# Patient Record
Sex: Female | Born: 1940 | Race: White | Hispanic: No | Marital: Married | Smoking: Former smoker
Health system: Southern US, Community
[De-identification: ages and names within clinical notes are randomized; demographics above are authoritative.]

## PROBLEM LIST (undated history)

## (undated) DIAGNOSIS — M2540 Effusion, unspecified joint: Secondary | ICD-10-CM

## (undated) DIAGNOSIS — M2550 Pain in unspecified joint: Secondary | ICD-10-CM

## (undated) DIAGNOSIS — C50919 Malignant neoplasm of unspecified site of unspecified female breast: Secondary | ICD-10-CM

## (undated) DIAGNOSIS — H269 Unspecified cataract: Secondary | ICD-10-CM

## (undated) DIAGNOSIS — E785 Hyperlipidemia, unspecified: Secondary | ICD-10-CM

## (undated) DIAGNOSIS — M1990 Unspecified osteoarthritis, unspecified site: Secondary | ICD-10-CM

## (undated) DIAGNOSIS — K219 Gastro-esophageal reflux disease without esophagitis: Secondary | ICD-10-CM

## (undated) HISTORY — PX: TUBAL LIGATION: SHX77

## (undated) HISTORY — PX: EYE SURGERY: SHX253

## (undated) HISTORY — PX: KNEE SURGERY: SHX244

## (undated) HISTORY — DX: Gastro-esophageal reflux disease without esophagitis: K21.9

## (undated) HISTORY — DX: Hyperlipidemia, unspecified: E78.5

## (undated) HISTORY — PX: LAMINECTOMY AND MICRODISCECTOMY SPINE: SHX1914

## (undated) HISTORY — PX: TONSILLECTOMY AND ADENOIDECTOMY: SUR1326

## (undated) HISTORY — DX: Malignant neoplasm of unspecified site of unspecified female breast: C50.919

## (undated) HISTORY — PX: CHOLECYSTECTOMY: SHX55

---

## 1997-09-15 ENCOUNTER — Other Ambulatory Visit: Admission: RE | Admit: 1997-09-15 | Discharge: 1997-09-15 | Payer: Self-pay | Admitting: *Deleted

## 1998-10-05 ENCOUNTER — Other Ambulatory Visit: Admission: RE | Admit: 1998-10-05 | Discharge: 1998-10-05 | Payer: Self-pay | Admitting: *Deleted

## 1999-10-07 ENCOUNTER — Encounter: Admission: RE | Admit: 1999-10-07 | Discharge: 1999-10-07 | Payer: Self-pay | Admitting: *Deleted

## 1999-11-07 ENCOUNTER — Other Ambulatory Visit: Admission: RE | Admit: 1999-11-07 | Discharge: 1999-11-07 | Payer: Self-pay | Admitting: *Deleted

## 2000-10-21 ENCOUNTER — Encounter: Admission: RE | Admit: 2000-10-21 | Discharge: 2000-10-21 | Payer: Self-pay | Admitting: *Deleted

## 2000-11-03 ENCOUNTER — Other Ambulatory Visit: Admission: RE | Admit: 2000-11-03 | Discharge: 2000-11-03 | Payer: Self-pay | Admitting: *Deleted

## 2001-08-26 ENCOUNTER — Ambulatory Visit (HOSPITAL_COMMUNITY): Admission: RE | Admit: 2001-08-26 | Discharge: 2001-08-26 | Payer: Self-pay | Admitting: Internal Medicine

## 2001-10-26 ENCOUNTER — Encounter: Admission: RE | Admit: 2001-10-26 | Discharge: 2001-10-26 | Payer: Self-pay | Admitting: *Deleted

## 2001-11-16 ENCOUNTER — Other Ambulatory Visit: Admission: RE | Admit: 2001-11-16 | Discharge: 2001-11-16 | Payer: Self-pay | Admitting: *Deleted

## 2002-02-18 ENCOUNTER — Ambulatory Visit (HOSPITAL_COMMUNITY): Admission: RE | Admit: 2002-02-18 | Discharge: 2002-02-18 | Payer: Self-pay | Admitting: Internal Medicine

## 2002-02-18 ENCOUNTER — Encounter: Payer: Self-pay | Admitting: Internal Medicine

## 2002-08-01 ENCOUNTER — Other Ambulatory Visit: Admission: RE | Admit: 2002-08-01 | Discharge: 2002-08-01 | Payer: Self-pay | Admitting: Dermatology

## 2002-11-01 ENCOUNTER — Encounter: Admission: RE | Admit: 2002-11-01 | Discharge: 2002-11-01 | Payer: Self-pay | Admitting: *Deleted

## 2002-11-24 ENCOUNTER — Other Ambulatory Visit: Admission: RE | Admit: 2002-11-24 | Discharge: 2002-11-24 | Payer: Self-pay | Admitting: *Deleted

## 2003-10-05 ENCOUNTER — Ambulatory Visit (HOSPITAL_COMMUNITY): Admission: RE | Admit: 2003-10-05 | Discharge: 2003-10-05 | Payer: Self-pay | Admitting: Family Medicine

## 2003-11-02 ENCOUNTER — Encounter: Admission: RE | Admit: 2003-11-02 | Discharge: 2003-11-02 | Payer: Self-pay | Admitting: *Deleted

## 2004-02-15 ENCOUNTER — Ambulatory Visit: Payer: Self-pay | Admitting: Internal Medicine

## 2004-07-04 ENCOUNTER — Ambulatory Visit: Payer: Self-pay | Admitting: Orthopedic Surgery

## 2004-07-10 ENCOUNTER — Ambulatory Visit (HOSPITAL_COMMUNITY): Admission: RE | Admit: 2004-07-10 | Discharge: 2004-07-10 | Payer: Self-pay | Admitting: Orthopedic Surgery

## 2004-07-24 ENCOUNTER — Ambulatory Visit: Payer: Self-pay | Admitting: Orthopedic Surgery

## 2004-07-25 ENCOUNTER — Encounter (HOSPITAL_COMMUNITY): Admission: RE | Admit: 2004-07-25 | Discharge: 2004-08-24 | Payer: Self-pay | Admitting: Orthopedic Surgery

## 2004-08-14 ENCOUNTER — Ambulatory Visit: Payer: Self-pay | Admitting: Orthopedic Surgery

## 2004-11-05 ENCOUNTER — Encounter: Admission: RE | Admit: 2004-11-05 | Discharge: 2004-11-05 | Payer: Self-pay | Admitting: *Deleted

## 2005-06-18 ENCOUNTER — Ambulatory Visit (HOSPITAL_COMMUNITY): Admission: RE | Admit: 2005-06-18 | Discharge: 2005-06-18 | Payer: Self-pay | Admitting: Family Medicine

## 2005-11-11 ENCOUNTER — Encounter: Admission: RE | Admit: 2005-11-11 | Discharge: 2005-11-11 | Payer: Self-pay | Admitting: *Deleted

## 2005-12-01 ENCOUNTER — Ambulatory Visit: Payer: Self-pay | Admitting: Orthopedic Surgery

## 2005-12-04 ENCOUNTER — Ambulatory Visit (HOSPITAL_COMMUNITY): Admission: RE | Admit: 2005-12-04 | Discharge: 2005-12-04 | Payer: Self-pay | Admitting: Orthopedic Surgery

## 2005-12-11 ENCOUNTER — Ambulatory Visit: Payer: Self-pay | Admitting: Orthopedic Surgery

## 2006-11-23 ENCOUNTER — Ambulatory Visit (HOSPITAL_COMMUNITY): Admission: RE | Admit: 2006-11-23 | Discharge: 2006-11-23 | Payer: Self-pay | Admitting: Obstetrics and Gynecology

## 2007-11-18 ENCOUNTER — Encounter: Payer: Self-pay | Admitting: Internal Medicine

## 2007-11-24 ENCOUNTER — Ambulatory Visit (HOSPITAL_COMMUNITY): Admission: RE | Admit: 2007-11-24 | Discharge: 2007-11-24 | Payer: Self-pay | Admitting: Family Medicine

## 2007-11-29 ENCOUNTER — Ambulatory Visit (HOSPITAL_COMMUNITY): Admission: RE | Admit: 2007-11-29 | Discharge: 2007-11-29 | Payer: Self-pay | Admitting: Family Medicine

## 2008-07-06 ENCOUNTER — Ambulatory Visit: Payer: Self-pay | Admitting: Cardiology

## 2008-07-07 ENCOUNTER — Ambulatory Visit: Payer: Self-pay | Admitting: Cardiology

## 2008-07-07 ENCOUNTER — Encounter: Payer: Self-pay | Admitting: Cardiology

## 2008-07-07 ENCOUNTER — Ambulatory Visit (HOSPITAL_COMMUNITY): Admission: RE | Admit: 2008-07-07 | Discharge: 2008-07-07 | Payer: Self-pay | Admitting: Cardiology

## 2008-07-25 ENCOUNTER — Telehealth: Payer: Self-pay | Admitting: Cardiology

## 2008-11-01 DIAGNOSIS — E785 Hyperlipidemia, unspecified: Secondary | ICD-10-CM | POA: Insufficient documentation

## 2008-12-11 ENCOUNTER — Ambulatory Visit (HOSPITAL_COMMUNITY): Admission: RE | Admit: 2008-12-11 | Discharge: 2008-12-11 | Payer: Self-pay | Admitting: Family Medicine

## 2008-12-18 ENCOUNTER — Encounter: Admission: RE | Admit: 2008-12-18 | Discharge: 2008-12-18 | Payer: Self-pay | Admitting: Family Medicine

## 2009-09-03 ENCOUNTER — Ambulatory Visit (HOSPITAL_COMMUNITY): Admission: RE | Admit: 2009-09-03 | Discharge: 2009-09-03 | Payer: Self-pay | Admitting: Family Medicine

## 2009-11-23 ENCOUNTER — Encounter: Admission: RE | Admit: 2009-11-23 | Discharge: 2009-11-23 | Payer: Self-pay | Admitting: Family Medicine

## 2009-12-17 ENCOUNTER — Ambulatory Visit (HOSPITAL_COMMUNITY): Admission: RE | Admit: 2009-12-17 | Discharge: 2009-12-17 | Payer: Self-pay | Admitting: Family Medicine

## 2010-03-16 ENCOUNTER — Encounter: Payer: Self-pay | Admitting: Family Medicine

## 2010-07-08 ENCOUNTER — Ambulatory Visit (INDEPENDENT_AMBULATORY_CARE_PROVIDER_SITE_OTHER): Payer: Medicare Other | Admitting: Internal Medicine

## 2010-07-08 DIAGNOSIS — K279 Peptic ulcer, site unspecified, unspecified as acute or chronic, without hemorrhage or perforation: Secondary | ICD-10-CM

## 2010-07-09 NOTE — Letter (Signed)
Jul 06, 2008    Corrie Mckusick, MD  [id number] [place]. [place], [place] [address]   RE:  Whitney Burns, Whitney Burns  MRN:  [id number]  /  DOB:  25-Apr-1940   Dear Whitney Burns:   It was my pleasure evaluating Whitney Burns on an urgent basis in the  office today at your request.  As you know, this very nice lady has  enjoyed generally excellent health.  She has no known prior  cardiovascular disease.  She has had no hypertension nor diabetes.  She  does have moderate hyperlipidemia, controlled with nonpharmacologic  means.  She has a less than 10-pack-year history of cigarette smoking,  but has not smoked for at least the past 10 years.  There is no striking  family history for coronary disease.  She awoke in the early morning  hours today, noting vague discomfort in her medial upper left arm.  She  describes this is as a pinching sensation.  She subsequently developed  an unusual sensation in the entire left upper extremity that she would  not characterize as numbness or pins and needles and certainly not as  pain.  She was unable to return to sleep.  These symptoms persisted,  prompting her to seek attention in your office where EKG was normal.  She was given sublingual nitroglycerin with some improvement in her  symptoms.  At the present time, she feels perfectly fine.   PAST MEDICAL HISTORY:  Otherwise notable for elective surgeries  including tonsillectomy, cholecystectomy, and tubal ligation.   ALLERGIES:  She has no known allergies.   CURRENT MEDICATIONS:  1. Estrogen replacement therapy.  2. Fish oil 3 capsules per day.  3. Aspirin 81 mg daily.  4. Calcium b.i.d.  5. Folate 1200 mg daily.  6. Vitamins.  7. Ibuprofen 1 b.i.d.   SOCIAL HISTORY:  Retired, but remains quite active including playing  tennis and riding a stationary bicycle.  She is married with 2 adult  children.  She does not use excessive amounts of alcohol.   FAMILY HISTORY:  Positive for emphysema in her father and  pneumonia in  her mother resulting in her death.   REVIEW OF SYSTEMS:  Notable for need for corrective lenses for near  vision, a dermatologic condition for which she is under the care of a  dermatologist, and a history of cartilage problems in the right knee.  All other systems reviewed and are negative.   On exam, pleasant, proportionate woman in no acute distress.  The weight is 142, blood pressure 120/60.  Respirations 12 and  unlabored.  HEENT:  EOMs full; pupils equal, round, reactive to light; normal lids  and conjunctivae; normal oral mucosa.  NECK:  No jugular venous distention; normal carotid upstrokes without  bruits.  ENDOCRINE:  No thyromegaly.  HEMATOPOIETIC:  No adenopathy.  LUNGS:  Clear.  CARDIAC:  Normal first and second heart sounds; normal PMI.  ABDOMEN:  Soft and nontender; normal bowel sounds; no organomegaly.  EXTREMITIES:  No edema; full range of motion in the left upper  extremity; lower.  NEUROLOGIC:  Symmetric strength and tone; normal cranial nerves.  PSYCHIATRIC:  Alert and oriented; normal affect.   EKG:  Normal sinus rhythm; nonspecific T-wave abnormality in the lateral  leads; otherwise unremarkable.   Laboratory from your office includes a lipid profile with total  cholesterol of 265, triglycerides of 114, HDL of 70, and LDL of 166.   IMPRESSION:  Whitney Burns has very atypical  symptoms, it sound more  neurologic than cardiac.  She has good exercise tolerance with no  exercise-induced symptoms.  She has few cardiovascular risk factors  other than hyperlipidemia and postmenopausal status.  Her likelihood of  coronary artery disease is low.  We will proceed with a stress  echocardiogram at her earliest convenience, hopefully tomorrow.   She has moderate hyperlipidemia.  I agree with your assessment that  pharmacologic therapy is not necessary.  We provided her with  information regarding a low-fat diet as well as with a suggestion that  she use  Benecol margarine, oat bran, and possibly Metamucil.   If Whitney Burns's stress echocardiogram is negative, she will not require  additional Cardiology followup.  I thank so much for sending this nice  woman to see me.  If you can change her blood pressure from 120/60 to  135/80, I would appreciate it.    Sincerely,      Whitney Friends. Dietrich Pates, MD, [facility]  Electronically Signed    RMR/MedQ  DD: 07/06/2008  DT: 07/07/2008  Job #: [id number]

## 2010-07-12 NOTE — Op Note (Signed)
[facility]  Patient:    Whitney Burns, Whitney Burns Visit Number: [id number] MRN: [id number]          Service Type: END Location: DAY Attending Physician:  Malissa Hippo Dictated by:   Lionel December, M.D. Proc. Date: 08/26/01 Admit Date:  08/26/2001 Discharge Date: 08/26/2001   CC:         Renne Musca, M.D.   Operative Report  PROCEDURE:  Esophagogastroduodenoscopy with esophageal dilatation followed by screening colonoscopy.  ENDOSCOPIST:  Lionel December, M.D.  INDICATION:  The patient is a 70 year old Caucasian female with a few months history of dysphagia primarily to solids.  There is no history of heartburn. She is also interested in having screening colonoscopy.  She is average risk for colorectal carcinoma.  Both the procedures and risks were reviewed with the patient and informed consent was obtained.  PREOPERATIVE MEDICATIONS:  Cetacaine spray for oropharyngeal topical anesthesia, Demerol 40 mg IV and Versed 6 mg in divided dose.  INSTRUMENT:  Olympus video system.  FINDINGS:  The procedures were performed in endoscopy suite.  The patients vital signs and O2 saturations were monitored during procedures and remained stable.  PROCEDURE #1 - Esophagogastroduodenoscopy:  The patient was placed in the left lateral decubitus position.  Endoscope was passed via oropharynx without any difficulty into esophagus.  Esophagus:  Mucosa of the esophagus was normal throughout.  Squamocolumnar junction was wavy but no hernia, ring or stricture were noted.  I also did not see any Zenkers diverticulum in the proximal esophagus.  Stomach:  It was empty and distended very well with insufflation.  Folds in the proximal stomach were normal.  Examination of the mucosa revealed a few petechiae of antrum with streaks of erythematous mucosa.  No erosions or ulcers were noted.  Pyloric channel was patent.  Angularis and fundus were examined by retroflexing  the scope and were normal.  Duodenum:  Examination of the bulb and second part of the duodenum was normal. Endoscope was withdrawn.  Esophageal dilatation was performed by passing 54-French Maloney dilator through the esophagus completely.  Some resistance was noted on passing this dilator but I was able to pass it completely.  As the dilator was withdrawn, endoscope was passed again and there was a bow-shaped mucosal tear in cervical esophagus suggesting disruption of esophageal web which was not apparent on initial exam.  Stomach was decompressed and endoscope was withdrawn and the patient was prepared for procedure #2.  PROCEDURE #2 - Colonoscopy:  Rectal exam was performed and no abnormality noted on external or digital exam.  Scope was placed in the rectum and advanced under direct vision to the sigmoid colon and beyond.  Scope was passed into the cecum, which was identified by appendiceal orifice and ileocecal valve.  Pictures were taken for the record.  As the scope was withdrawn, colonic mucosa was carefully examined.  There was a single diverticulum at hepatic flexure and another diverticulum at sigmoid colon. There were no polyps or tumor masses.  The rectal mucosa was normal.  Scope was retroflexed to examine anorectal junction, which was unremarkable. Endoscope was straightened and withdrawn.  The patient tolerated the procedures well.  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 2. Antral gastritis. 3. Two diverticula (one at hepatic flexure, one at sigmoid colon), otherwise    normal colonoscopy.  RECOMMENDATIONS:  H. pylori serology will be checked today.  She should continue yearly Hemoccults.  Unless she were to have symptoms,  she may consider another screening study in 10 years from now. Dictated by:   Lionel December, M.D. Attending Physician:  Malissa Hippo DD:  08/26/01 TD:  08/30/01 Job: [id number]

## 2010-11-28 ENCOUNTER — Other Ambulatory Visit (HOSPITAL_COMMUNITY): Payer: Self-pay | Admitting: Orthopedic Surgery

## 2010-11-28 DIAGNOSIS — R52 Pain, unspecified: Secondary | ICD-10-CM

## 2010-12-02 ENCOUNTER — Ambulatory Visit (HOSPITAL_COMMUNITY): Payer: Medicare Other

## 2010-12-10 ENCOUNTER — Ambulatory Visit (HOSPITAL_COMMUNITY)
Admission: RE | Admit: 2010-12-10 | Discharge: 2010-12-10 | Disposition: A | Discharge status: Home / Self Care | Payer: Medicare Other | Source: Ambulatory Visit | Attending: Orthopedic Surgery | Admitting: Orthopedic Surgery

## 2010-12-10 DIAGNOSIS — R9389 Abnormal findings on diagnostic imaging of other specified body structures: Secondary | ICD-10-CM | POA: Insufficient documentation

## 2010-12-10 DIAGNOSIS — M899 Disorder of bone, unspecified: Secondary | ICD-10-CM | POA: Insufficient documentation

## 2010-12-10 DIAGNOSIS — R52 Pain, unspecified: Secondary | ICD-10-CM

## 2010-12-10 DIAGNOSIS — M533 Sacrococcygeal disorders, not elsewhere classified: Secondary | ICD-10-CM | POA: Insufficient documentation

## 2011-02-06 ENCOUNTER — Encounter: Payer: Self-pay | Admitting: Cardiology

## 2011-05-22 ENCOUNTER — Encounter (INDEPENDENT_AMBULATORY_CARE_PROVIDER_SITE_OTHER): Payer: Self-pay | Admitting: *Deleted

## 2011-05-22 ENCOUNTER — Other Ambulatory Visit (INDEPENDENT_AMBULATORY_CARE_PROVIDER_SITE_OTHER): Payer: Self-pay | Admitting: *Deleted

## 2011-05-22 DIAGNOSIS — Z1211 Encounter for screening for malignant neoplasm of colon: Secondary | ICD-10-CM

## 2011-08-06 ENCOUNTER — Telehealth (INDEPENDENT_AMBULATORY_CARE_PROVIDER_SITE_OTHER): Payer: Self-pay | Admitting: *Deleted

## 2011-08-06 NOTE — Telephone Encounter (Signed)
PCP/Requesting MD: golding  Name & DOB: Keyara Falconi 26-Nov-2040     Procedure: tcs  Reason/Indication:  screening  Has patient had this procedure before?  yes  If so, when, by whom and where?  08/2001  Is there a family history of colon cancer?  no  Who?  What age when diagnosed?    Is patient diabetic?   no      Does patient have prosthetic heart valve?  no  Do you have a pacemaker?  no  Has patient had joint replacement within last 12 months?  no  Is patient on Coumadin, Plavix and/or Aspirin? yes  Medications: celebrax 200 mg every other day, fish oil bid, asa 81 mg daily, calcium w/ D, glucosamine bid, omeprazole 40 mg daily, multi vit, miralax  Allergies: nkda  Medication Adjustment: asa 2 dasy  Procedure date & time: 09/03/11 at 730

## 2011-08-08 NOTE — Telephone Encounter (Signed)
agree

## 2011-08-20 ENCOUNTER — Encounter (HOSPITAL_COMMUNITY): Payer: Self-pay

## 2011-08-27 ENCOUNTER — Other Ambulatory Visit (HOSPITAL_COMMUNITY): Payer: Self-pay | Admitting: Family Medicine

## 2011-08-27 DIAGNOSIS — Z139 Encounter for screening, unspecified: Secondary | ICD-10-CM

## 2011-08-27 DIAGNOSIS — E559 Vitamin D deficiency, unspecified: Secondary | ICD-10-CM

## 2011-09-01 ENCOUNTER — Inpatient Hospital Stay (HOSPITAL_COMMUNITY): Admission: RE | Admit: 2011-09-01 | Payer: Medicare Other | Source: Ambulatory Visit

## 2011-09-02 ENCOUNTER — Ambulatory Visit (HOSPITAL_COMMUNITY): Payer: Medicare Other

## 2011-09-02 ENCOUNTER — Other Ambulatory Visit (INDEPENDENT_AMBULATORY_CARE_PROVIDER_SITE_OTHER): Payer: Self-pay | Admitting: *Deleted

## 2011-09-02 ENCOUNTER — Other Ambulatory Visit (HOSPITAL_COMMUNITY): Payer: Medicare Other

## 2011-09-02 DIAGNOSIS — Z1211 Encounter for screening for malignant neoplasm of colon: Secondary | ICD-10-CM

## 2011-09-03 ENCOUNTER — Encounter (HOSPITAL_COMMUNITY): Payer: Self-pay | Admitting: *Deleted

## 2011-09-03 ENCOUNTER — Ambulatory Visit (HOSPITAL_COMMUNITY)
Admission: RE | Admit: 2011-09-03 | Discharge: 2011-09-03 | Disposition: A | Discharge status: Home / Self Care | Payer: Medicare Other | Source: Ambulatory Visit | Attending: Internal Medicine | Admitting: Internal Medicine

## 2011-09-03 ENCOUNTER — Encounter (HOSPITAL_COMMUNITY)
Admission: RE | Disposition: A | Discharge status: Home / Self Care | Payer: Self-pay | Source: Ambulatory Visit | Attending: Internal Medicine

## 2011-09-03 DIAGNOSIS — E785 Hyperlipidemia, unspecified: Secondary | ICD-10-CM | POA: Insufficient documentation

## 2011-09-03 DIAGNOSIS — Z1211 Encounter for screening for malignant neoplasm of colon: Secondary | ICD-10-CM | POA: Insufficient documentation

## 2011-09-03 DIAGNOSIS — K5730 Diverticulosis of large intestine without perforation or abscess without bleeding: Secondary | ICD-10-CM | POA: Insufficient documentation

## 2011-09-03 HISTORY — DX: Unspecified osteoarthritis, unspecified site: M19.90

## 2011-09-03 HISTORY — PX: COLONOSCOPY: SHX5424

## 2011-09-03 SURGERY — COLONOSCOPY
Anesthesia: Moderate Sedation

## 2011-09-03 MED ORDER — SODIUM CHLORIDE 0.45 % IV SOLN
Freq: Once | INTRAVENOUS | Status: AC
  Administered 2011-09-03: 1000 mL via INTRAVENOUS

## 2011-09-03 MED ORDER — MEPERIDINE HCL 50 MG/ML IJ SOLN
INTRAMUSCULAR | Status: DC | PRN
  Administered 2011-09-03 (×2): 25 mg via INTRAVENOUS

## 2011-09-03 MED ORDER — SIMETHICONE 40 MG/0.6ML PO SUSP
ORAL | Status: DC | PRN
  Administered 2011-09-03: 09:00:00

## 2011-09-03 MED ORDER — MEPERIDINE HCL 50 MG/ML IJ SOLN
INTRAMUSCULAR | Status: AC
  Filled 2011-09-03: qty 1

## 2011-09-03 MED ORDER — MIDAZOLAM HCL 5 MG/5ML IJ SOLN
INTRAMUSCULAR | Status: AC
  Filled 2011-09-03: qty 10

## 2011-09-03 MED ORDER — MIDAZOLAM HCL 5 MG/5ML IJ SOLN
INTRAMUSCULAR | Status: DC | PRN
  Administered 2011-09-03 (×2): 2 mg via INTRAVENOUS
  Administered 2011-09-03: 1 mg via INTRAVENOUS

## 2011-09-03 NOTE — H&P (Signed)
Whitney Burns is an 71 y.o. female.   Chief Complaint: Patient is here for colonoscopy. HPI: Patient is 71 year old Caucasian female who is here for screening colonoscopy. Patient's last exam was 10 years ago. She denies abdominal pain change in bowel habits bleeding. Family history is negative for colorectal carcinoma.  Past Medical History  Diagnosis Date  . Hyperlipidemia   . Arthritis     Past Surgical History  Procedure Date  . Tonsillectomy and adenoidectomy   . Cholecystectomy   . Tubal ligation     Family History  Problem Relation Age of Onset  . Pneumonia Mother   . Emphysema Father    Social History:  reports that she has quit smoking. She does not have any smokeless tobacco history on file. She reports that she does not drink alcohol or use illicit drugs.  Allergies: No Known Allergies  Medications Prior to Admission  Medication Sig Dispense Refill  . Calcium Carbonate-Vitamin D (CALCIUM + D PO) Take 1 tablet by mouth daily. Unsure of strength      . fish oil-omega-3 fatty acids 1000 MG capsule Take 1 g by mouth 2 (two) times daily.      Marland Kitchen GLUCOSAMINE PO Take 2 tablets by mouth daily. Unknown strength      . meloxicam (MOBIC) 15 MG tablet Take 15 mg by mouth daily.      . Multiple Vitamin (MULTIVITAMIN WITH MINERALS) TABS Take 1 tablet by mouth daily.      Marland Kitchen omeprazole (PRILOSEC) 40 MG capsule Take 40 mg by mouth daily.      . promethazine (PHENERGAN) 25 MG tablet Take 12.5 mg by mouth daily as needed. For sleep      . aspirin EC 81 MG tablet Take 81 mg by mouth daily.      . polyethylene glycol powder (GLYCOLAX/MIRALAX) powder Take 17 g by mouth daily.        No results found for this or any previous visit (from the past 48 hour(s)). No results found.  ROS  Blood pressure 136/77, pulse 64, temperature 98 F (36.7 C), temperature source Oral, resp. rate 20, height 5\' 2"  (1.575 m), weight 132 lb (59.875 kg), SpO2 100.00%. Physical Exam  Constitutional: She  appears well-developed and well-nourished.  HENT:  Mouth/Throat: Oropharynx is clear and moist.  Eyes: Conjunctivae are normal. No scleral icterus.  Neck: No thyromegaly present.  Cardiovascular: Normal rate, regular rhythm and normal heart sounds.   No murmur heard. Respiratory: Effort normal and breath sounds normal.  GI: Soft. She exhibits no distension and no mass. There is no tenderness.  Musculoskeletal: She exhibits no edema.  Lymphadenopathy:    She has no cervical adenopathy.  Neurological: She is alert.  Skin: Skin is warm and dry.     Assessment/Plan Average risk screening colonoscopy.  Paisly Fingerhut U 09/03/2011, 8:27 AM

## 2011-09-03 NOTE — Op Note (Signed)
COLONOSCOPY PROCEDURE REPORT  PATIENT:  Whitney Burns  MR#:  [id number] Birthdate:  06-30-1940, 71 y.o., female Endoscopist:  Dr. Malissa Hippo, MD Referred By:  Dr. Colette Ribas, MD Procedure Date: 09/03/2011  Procedure:   Colonoscopy  Indications:  Patient is 71 year old Caucasian female was undergoing average risk screening colonoscopy. Her last exam was 10 years ago. She has chronic constipation well controlled with diet and polyethylene glycol.  Informed Consent:  The procedure and risks were reviewed with the patient and informed consent was obtained.  Medications:  Demerol 50 mg IV Versed 5 mg IV  Description of procedure:  After a digital rectal exam was performed, that colonoscope was advanced from the anus through the rectum and colon to the area of the cecum, ileocecal valve and appendiceal orifice. The cecum was deeply intubated. These structures were well-seen and photographed for the record. From the level of the cecum and ileocecal valve, the scope was slowly and cautiously withdrawn. The mucosal surfaces were carefully surveyed utilizing scope tip to flexion to facilitate fold flattening as needed. The scope was pulled down into the rectum where a thorough exam including retroflexion was performed.  Findings:   Prep satisfactory. Moderate number of diverticula at sigmoid colon. No evidence of colonic polyps or other abnormalities. Normal rectal mucosa and anal rectal junction.  Therapeutic/Diagnostic Maneuvers Performed:  None  Complications:  None  Cecal Withdrawal Time:  13 minutes  Impression:  Examination performed to cecum. Moderate number of diverticula at sigmoid colon otherwise normal examination.  Recommendations:  Standard instructions given. Next screening examination in 10 years.  REHMAN,NAJEEB U  09/03/2011 9:04 AM  CC: Dr. Colette Ribas, MD & Dr. Bonnetta Barry ref. provider found

## 2011-09-04 ENCOUNTER — Encounter (HOSPITAL_COMMUNITY): Payer: Self-pay | Admitting: Internal Medicine

## 2011-09-04 ENCOUNTER — Ambulatory Visit (HOSPITAL_COMMUNITY)
Admission: RE | Admit: 2011-09-04 | Discharge: 2011-09-04 | Disposition: A | Discharge status: Home / Self Care | Payer: Medicare Other | Source: Ambulatory Visit | Attending: Family Medicine | Admitting: Family Medicine

## 2011-09-04 DIAGNOSIS — Z1231 Encounter for screening mammogram for malignant neoplasm of breast: Secondary | ICD-10-CM | POA: Insufficient documentation

## 2011-09-04 DIAGNOSIS — Z139 Encounter for screening, unspecified: Secondary | ICD-10-CM

## 2011-09-10 ENCOUNTER — Other Ambulatory Visit (HOSPITAL_COMMUNITY): Payer: Medicare Other

## 2011-09-24 ENCOUNTER — Ambulatory Visit (HOSPITAL_COMMUNITY)
Admission: RE | Admit: 2011-09-24 | Discharge: 2011-09-24 | Disposition: A | Discharge status: Home / Self Care | Payer: Medicare Other | Source: Ambulatory Visit | Attending: Family Medicine | Admitting: Family Medicine

## 2011-09-24 DIAGNOSIS — M899 Disorder of bone, unspecified: Secondary | ICD-10-CM | POA: Insufficient documentation

## 2011-09-24 DIAGNOSIS — Z139 Encounter for screening, unspecified: Secondary | ICD-10-CM

## 2011-09-24 DIAGNOSIS — E559 Vitamin D deficiency, unspecified: Secondary | ICD-10-CM | POA: Insufficient documentation

## 2011-09-24 DIAGNOSIS — Z13820 Encounter for screening for osteoporosis: Secondary | ICD-10-CM | POA: Insufficient documentation

## 2012-06-16 ENCOUNTER — Ambulatory Visit (HOSPITAL_COMMUNITY)
Admission: RE | Admit: 2012-06-16 | Discharge: 2012-06-16 | Disposition: A | Discharge status: Home / Self Care | Payer: Medicare Other | Source: Ambulatory Visit | Attending: Family Medicine | Admitting: Family Medicine

## 2012-06-16 DIAGNOSIS — IMO0001 Reserved for inherently not codable concepts without codable children: Secondary | ICD-10-CM | POA: Insufficient documentation

## 2012-06-16 DIAGNOSIS — M6281 Muscle weakness (generalized): Secondary | ICD-10-CM | POA: Insufficient documentation

## 2012-06-16 NOTE — Evaluation (Signed)
Physical Therapy Evaluation  Patient Details  Name: [place] Burns MRN: [id number] Date of Birth: Jul 08, 1940 Charge:  eval Today's Date: 06/16/2012 Time: 1520-1605 PT Time Calculation (min): 45 min              Visit#: 1 of 8  Re-eval: 07/16/12 Assessment Diagnosis: diskectomy Surgical Date: 04/21/12 Prior Therapy: none  Authorization: medicare     Past Medical History:  Past Medical History  Diagnosis Date  . Hyperlipidemia   . Arthritis    Past Surgical History:  Past Surgical History  Procedure Laterality Date  . Tonsillectomy and adenoidectomy    . Cholecystectomy    . Tubal ligation    . Colonoscopy  09/03/2011    Procedure: COLONOSCOPY;  Surgeon: [place] Hippo, MD;  Location: [facility];  Service: Endoscopy;  Laterality: N/A;  730    Subjective Symptoms/Limitations Symptoms:  Whitney Burns states thatn on September 3rd she had extreme pain in her right leg.  It took her several days to be able to put weight through her leg but then the pain subsided. She normally goes down to [place] for six months in the winter.  While she was in  [place] the pain returned so she went to her orthopedic MD who checked her hip out and said that her hip was fine.  When she was at  a golf tournament she began to have  excruciating pain in her R leg.  She had an MRI which showed a bulged disc in her back.  Whitney Burns had  a diskectomy of right L4-5 on 04/21/2012 and is now being referred to physcial therapy.  She states that since the surgery she has been doing great.  She has had no pain since the surgery.   How long can you sit comfortably?: no problem How long can you stand comfortably?: no problem How long can you walk comfortably?: walking daily for two miles a day Pain Assessment Currently in Pain?:  (stiffness in am but no pain.)   Balance Screening Balance Screen Has the patient fallen in the past 6 months: Yes   Assessment RLE Strength Right Hip Flexion: 4/5 Right Hip  Extension: 3/5 Right Hip ABduction: 4/5 Right Knee Flexion: 3+/5 Right Knee Extension: 5/5 Right Ankle Dorsiflexion: 3/5 LLE Strength Left Hip Flexion: 5/5 Left Hip Extension: 3/5 Left Hip ABduction: 5/5 Left Knee Flexion: 5/5 Left Knee Extension: 5/5 Left Ankle Dorsiflexion: 5/5 Lumbar AROM Lumbar Extension:  (decreased 20%) Lumbar - Right Side Bend: wnl Lumbar - Left Side Bend: wnl  Exercise/Treatments    Stretches Active Hamstring Stretch: 3 reps;30 seconds Passive Hamstring Stretch: 3 reps;30 seconds Supine Ab Set: 10 reps Bent Knee Raise: 10 reps   Physical Therapy Assessment and Plan PT Assessment and Plan Clinical Impression Statement: Whitney Burns is a 72 yo female who has had a recent L4-5 diskectomy who has been referred to therapy.  Examination shows decreased core strength and decreased knowledge of proper body mechanics.  PT will benefit from skilled PT to edcucate in body mechanics /posture and core strengthening. Rehab Potential: Good PT Frequency: Min 2X/week PT Duration: 4 weeks PT Treatment/Interventions: Manual techniques;Patient/family education;Therapeutic exercise;Therapeutic activities PT Plan: begin postural t-band ex, standing functional squat, bridge, clam and heelslides.  Advance to SL abduction, prone exercises     Goals Home Exercise Program Pt will Perform Home Exercise Program: Independently PT Short Term Goals Time to Complete Short Term Goals: 2 weeks PT Short Term Goal 1: Pt to be  able to verbalize the importance of good posture and good body mechanics in back care. PT Long Term Goals Time to Complete Long Term Goals: 4 weeks PT Long Term Goal 1: Pt to be I in HEP PT Long Term Goal 2: Pt strength to be wnl to allow pt to go up and down steps, come up from a squatting position without difficult   Problem List Patient Active Problem List  Diagnosis  . HYPERLIPIDEMIA    PT - End of Session Equipment Utilized During Treatment: Gait  belt Activity Tolerance: Patient tolerated treatment well General Behavior During Therapy: WFL for tasks assessed/performed PT Plan of Care PT Home Exercise Plan: given  GP Functional Assessment Tool Used: clinical judgement Functional Limitation: Self care Self Care Current Status (W0981): At least 1 percent but less than 20 percent impaired, limited or restricted Self Care Goal Status (X9147): 0 percent impaired, limited or restricted  Whitney Burns,Whitney Burns 06/16/2012, 4:40 PM  Physician Documentation Your signature is required to indicate approval of the treatment plan as stated above.  Please sign and either send electronically or make a copy of this report for your files and return this physician signed original.   Please mark one 1.__approve of plan  2. ___approve of plan with the following conditions.   ______________________________                                                          _____________________ Physician Signature                                                                                                             Date

## 2012-06-22 ENCOUNTER — Ambulatory Visit (HOSPITAL_COMMUNITY)
Admission: RE | Admit: 2012-06-22 | Discharge: 2012-06-22 | Disposition: A | Discharge status: Home / Self Care | Payer: Medicare Other | Source: Ambulatory Visit | Attending: *Deleted | Admitting: *Deleted

## 2012-06-22 NOTE — Progress Notes (Signed)
Physical Therapy Treatment Patient Details  Name: Whitney Burns MRN: [id number] Date of Birth: [birth year]  Today's Date: 06/22/2012 Time: 1601-1640 PT Time Calculation (min): 39 min  Visit#: 2 of 8  Re-eval: 07/16/12 Charges: Therex x 38'   Authorization: medicare     Subjective: Symptoms/Limitations Symptoms: Pt reports HEP compliance. Pain Assessment Currently in Pain?: No/denies   Exercise/Treatments Stretches Active Hamstring Stretch: 3 reps;30 seconds Standing Functional Squats: 10 reps Scapular Retraction: 10 reps;Theraband Theraband Level (Scapular Retraction): Level 3 (Green) Row: 10 reps;Theraband Theraband Level (Row): Level 3 (Green) Shoulder Extension: 10 reps;Theraband Theraband Level (Shoulder Extension): Level 3 (Green) Supine Ab Set: 10 reps Bent Knee Raise: 10 reps Bridge: 10 reps Straight Leg Raise: 5 reps  Physical Therapy Assessment and Plan PT Assessment and Plan Clinical Impression Statement: Progressed stabilization exercises to improve postural strength and stability. Pt has most difficulty with supine SLR secondary to hip flexor weakness. Pt completes all therex well after initial cueing and demo. Rehab Potential: Good PT Frequency: Min 2X/week PT Duration: 4 weeks PT Treatment/Interventions: Manual techniques;Patient/family education;Therapeutic exercise;Therapeutic activities PT Plan: Begin side lying clams next session. Continue to progress core and lower extremity strength per PT POC.      Problem List Patient Active Problem List   Diagnosis Date Noted  . HYPERLIPIDEMIA 11/01/2008    PT - End of Session Equipment Utilized During Treatment: Gait belt Activity Tolerance: Patient tolerated treatment well General Behavior During Therapy: [facility] for tasks assessed/performed  Seth Bake, PTA  06/22/2012, 5:02 PM

## 2012-06-24 ENCOUNTER — Ambulatory Visit (INDEPENDENT_AMBULATORY_CARE_PROVIDER_SITE_OTHER): Payer: Medicare Other | Admitting: Otolaryngology

## 2012-06-24 DIAGNOSIS — H6120 Impacted cerumen, unspecified ear: Secondary | ICD-10-CM

## 2012-06-24 DIAGNOSIS — H903 Sensorineural hearing loss, bilateral: Secondary | ICD-10-CM

## 2012-06-25 ENCOUNTER — Ambulatory Visit (HOSPITAL_COMMUNITY)
Admission: RE | Admit: 2012-06-25 | Discharge: 2012-06-25 | Disposition: A | Discharge status: Home / Self Care | Payer: Medicare Other | Source: Ambulatory Visit | Attending: Family Medicine | Admitting: Family Medicine

## 2012-06-25 DIAGNOSIS — M6281 Muscle weakness (generalized): Secondary | ICD-10-CM | POA: Insufficient documentation

## 2012-06-25 DIAGNOSIS — IMO0001 Reserved for inherently not codable concepts without codable children: Secondary | ICD-10-CM | POA: Insufficient documentation

## 2012-06-25 NOTE — Evaluation (Signed)
Physical Therapy Discharge   Patient Details  Name: Whitney Burns MRN: [id number] Date of Birth: 08-Dec-1940  Today's Date: 06/25/2012 Time: 1521-1600 PT Time Calculation (min): 39 min              Visit#: 3 of 8  Re-eval: 07/16/12 Authorization: medicare     Charges: Therex x 25' MMT x 1 ROMM x 1    Past Medical History:  Past Medical History  Diagnosis Date  . Hyperlipidemia   . Arthritis    Past Surgical History:  Past Surgical History  Procedure Laterality Date  . Tonsillectomy and adenoidectomy    . Cholecystectomy    . Tubal ligation    . Colonoscopy  09/03/2011    Procedure: COLONOSCOPY;  Surgeon: Malissa Hippo, MD;  Location: [facility];  Service: Endoscopy;  Laterality: N/A;  730    Subjective Symptoms/Limitations Symptoms: Pt states that the stiffness in her low back has decreased since starting therapy. Pain Assessment Currently in Pain?: No/denies    Assessment RLE Strength Right Hip Flexion: 5/5 Right Hip Extension: 4/5 Right Hip ABduction: 5/5 Right Knee Flexion:  (4+/5 was 3+/5) Right Knee Extension: 5/5 Right Ankle Dorsiflexion: 5/5 LLE Strength Left Hip Flexion: 5/5 Left Hip Extension: 4/5 Left Hip ABduction: 5/5 Left Knee Flexion: 5/5 Left Knee Extension: 5/5 Left Ankle Dorsiflexion: 5/5 Lumbar AROM Lumbar Extension: wnl Lumbar - Right Side Bend: wnl Lumbar - Left Side Bend: wnl  Exercise/Treatments Standing Scapular Retraction: 10 reps;Theraband Theraband Level (Scapular Retraction): Level 3 (Green) Row: 10 reps;Theraband Theraband Level (Row): Level 3 (Green) Shoulder Extension: 10 reps;Theraband Theraband Level (Shoulder Extension): Level 3 (Green) Seated Other Seated Lumbar Exercises: Heel/toe roll in/out 10 x 5" each Supine Ab Set: 10 reps Bridge: 10 reps Straight Leg Raise: 10 reps Sidelying Clam: 10 reps;5 seconds Hip Abduction: 10 reps Prone  Straight Leg Raise: 10 reps Other Prone Lumbar Exercises: Heel  squeeze 10x5"  Physical Therapy Assessment and Plan PT Assessment and Plan Clinical Impression Statement: Pt is pain free and without complaint throughout session. Pt completes therex independently with correct form. Pt is able to explain the importance of body mechanics and posture. Pt is comfortable with D/C to HEP. Rehab Potential: Good PT Frequency: Min 2X/week PT Duration: 4 weeks PT Treatment/Interventions: Manual techniques;Patient/family education;Therapeutic exercise;Therapeutic activities PT Plan: Recommend D/C to HEP.    Goals Home Exercise Program Pt will Perform Home Exercise Program: Independently PT Short Term Goals Time to Complete Short Term Goals: 2 weeks PT Short Term Goal 1: Pt to be able to verbalize the importance of good posture and good body mechanics in back care. PT Short Term Goal 1 - Progress: Met PT Long Term Goals Time to Complete Long Term Goals: 4 weeks PT Long Term Goal 1: Pt to be I in HEP PT Long Term Goal 1 - Progress: Met PT Long Term Goal 2: Pt strength to be wnl to allow pt to go up and down steps, come up from a squatting position without difficult   Problem List Patient Active Problem List   Diagnosis Date Noted  . HYPERLIPIDEMIA 11/01/2008    PT - End of Session Equipment Utilized During Treatment: Gait belt Activity Tolerance: Patient tolerated treatment well General Behavior During Therapy: WFL for tasks assessed/performed PT Plan of Care PT Home Exercise Plan: Given  GP Functional Assessment Tool Used: clinical judgement Functional Limitation: Mobility: Walking and moving around Mobility: Walking and Moving Around Goal Status (W0981): 0 percent impaired, limited  or restricted Mobility: Walking and Moving Around Discharge Status [phone]): 0 percent impaired, limited or restricted  Antonieta Iba 06/25/2012, 4:27 PM  Physician Documentation Your signature is required to indicate approval of the treatment plan as stated  above.  Please sign and either send electronically or make a copy of this report for your files and return this physician signed original.   Please mark one 1.__approve of plan  2. ___approve of plan with the following conditions.   ______________________________                                                          _____________________ Physician Signature                                                                                                             Date

## 2012-06-29 ENCOUNTER — Ambulatory Visit (HOSPITAL_COMMUNITY): Payer: Medicare Other | Admitting: *Deleted

## 2012-07-01 ENCOUNTER — Ambulatory Visit (HOSPITAL_COMMUNITY): Payer: Medicare Other | Admitting: Physical Therapy

## 2012-07-06 ENCOUNTER — Ambulatory Visit (HOSPITAL_COMMUNITY): Payer: Medicare Other | Admitting: Physical Therapy

## 2012-07-08 ENCOUNTER — Ambulatory Visit (HOSPITAL_COMMUNITY): Payer: Medicare Other | Admitting: Physical Therapy

## 2012-11-24 ENCOUNTER — Other Ambulatory Visit (HOSPITAL_COMMUNITY): Payer: Self-pay | Admitting: Family Medicine

## 2012-11-24 DIAGNOSIS — Z139 Encounter for screening, unspecified: Secondary | ICD-10-CM

## 2012-11-26 ENCOUNTER — Ambulatory Visit (HOSPITAL_COMMUNITY)
Admission: RE | Admit: 2012-11-26 | Discharge: 2012-11-26 | Disposition: A | Discharge status: Home / Self Care | Payer: Medicare Other | Source: Ambulatory Visit | Attending: Family Medicine | Admitting: Family Medicine

## 2012-11-26 DIAGNOSIS — Z139 Encounter for screening, unspecified: Secondary | ICD-10-CM

## 2012-11-26 DIAGNOSIS — Z1231 Encounter for screening mammogram for malignant neoplasm of breast: Secondary | ICD-10-CM | POA: Insufficient documentation

## 2012-12-01 ENCOUNTER — Other Ambulatory Visit (HOSPITAL_COMMUNITY): Payer: Self-pay | Admitting: Family Medicine

## 2012-12-01 ENCOUNTER — Other Ambulatory Visit: Payer: Self-pay | Admitting: Family Medicine

## 2012-12-01 DIAGNOSIS — R928 Other abnormal and inconclusive findings on diagnostic imaging of breast: Secondary | ICD-10-CM

## 2012-12-08 ENCOUNTER — Encounter (HOSPITAL_COMMUNITY): Payer: Medicare Other

## 2012-12-08 ENCOUNTER — Ambulatory Visit (HOSPITAL_COMMUNITY): Payer: Medicare Other

## 2012-12-17 ENCOUNTER — Other Ambulatory Visit (HOSPITAL_COMMUNITY): Payer: Self-pay | Admitting: Family Medicine

## 2012-12-17 ENCOUNTER — Ambulatory Visit
Admission: RE | Admit: 2012-12-17 | Discharge: 2012-12-17 | Disposition: A | Discharge status: Home / Self Care | Payer: Medicare Other | Source: Ambulatory Visit | Attending: Family Medicine | Admitting: Family Medicine

## 2012-12-17 ENCOUNTER — Other Ambulatory Visit: Payer: Self-pay | Admitting: Family Medicine

## 2012-12-17 DIAGNOSIS — N6320 Unspecified lump in the left breast, unspecified quadrant: Secondary | ICD-10-CM

## 2012-12-17 DIAGNOSIS — R928 Other abnormal and inconclusive findings on diagnostic imaging of breast: Secondary | ICD-10-CM

## 2012-12-20 ENCOUNTER — Ambulatory Visit
Admission: RE | Admit: 2012-12-20 | Discharge: 2012-12-20 | Disposition: A | Discharge status: Home / Self Care | Payer: Medicare Other | Source: Ambulatory Visit | Attending: Family Medicine | Admitting: Family Medicine

## 2012-12-20 DIAGNOSIS — N6320 Unspecified lump in the left breast, unspecified quadrant: Secondary | ICD-10-CM

## 2012-12-21 ENCOUNTER — Other Ambulatory Visit: Payer: Self-pay | Admitting: Family Medicine

## 2012-12-21 ENCOUNTER — Telehealth: Payer: Self-pay | Admitting: *Deleted

## 2012-12-21 DIAGNOSIS — D0512 Intraductal carcinoma in situ of left breast: Secondary | ICD-10-CM

## 2012-12-21 DIAGNOSIS — C50412 Malignant neoplasm of upper-outer quadrant of left female breast: Secondary | ICD-10-CM | POA: Insufficient documentation

## 2012-12-21 NOTE — Telephone Encounter (Signed)
Confirmed [facility] for 12/29/12 at 0800.  Instructions and contact information given.

## 2012-12-22 ENCOUNTER — Encounter (HOSPITAL_COMMUNITY): Payer: Medicare Other

## 2012-12-22 ENCOUNTER — Other Ambulatory Visit (HOSPITAL_COMMUNITY): Payer: Medicare Other

## 2012-12-27 ENCOUNTER — Ambulatory Visit
Admission: RE | Admit: 2012-12-27 | Discharge: 2012-12-27 | Disposition: A | Discharge status: Home / Self Care | Payer: Medicare Other | Source: Ambulatory Visit | Attending: Family Medicine | Admitting: Family Medicine

## 2012-12-27 DIAGNOSIS — D0512 Intraductal carcinoma in situ of left breast: Secondary | ICD-10-CM

## 2012-12-27 MED ORDER — GADOBENATE DIMEGLUMINE 529 MG/ML IV SOLN
12.0000 mL | Freq: Once | INTRAVENOUS | Status: AC | PRN
  Administered 2012-12-27: 12 mL via INTRAVENOUS

## 2012-12-29 ENCOUNTER — Telehealth: Payer: Self-pay | Admitting: *Deleted

## 2012-12-29 ENCOUNTER — Ambulatory Visit (HOSPITAL_BASED_OUTPATIENT_CLINIC_OR_DEPARTMENT_OTHER): Payer: Medicare Other | Admitting: General Surgery

## 2012-12-29 ENCOUNTER — Ambulatory Visit: Payer: Medicare Other | Attending: General Surgery | Admitting: Physical Therapy

## 2012-12-29 ENCOUNTER — Other Ambulatory Visit (HOSPITAL_BASED_OUTPATIENT_CLINIC_OR_DEPARTMENT_OTHER): Payer: Medicare Other | Admitting: Lab

## 2012-12-29 ENCOUNTER — Ambulatory Visit (HOSPITAL_BASED_OUTPATIENT_CLINIC_OR_DEPARTMENT_OTHER): Payer: Medicare Other | Admitting: Oncology

## 2012-12-29 ENCOUNTER — Encounter: Payer: Self-pay | Admitting: *Deleted

## 2012-12-29 ENCOUNTER — Encounter: Payer: Self-pay | Admitting: Oncology

## 2012-12-29 ENCOUNTER — Encounter (INDEPENDENT_AMBULATORY_CARE_PROVIDER_SITE_OTHER): Payer: Self-pay | Admitting: General Surgery

## 2012-12-29 ENCOUNTER — Ambulatory Visit
Admission: RE | Admit: 2012-12-29 | Discharge: 2012-12-29 | Disposition: A | Discharge status: Home / Self Care | Payer: Medicare Other | Source: Ambulatory Visit | Attending: Radiation Oncology | Admitting: Radiation Oncology

## 2012-12-29 ENCOUNTER — Ambulatory Visit: Payer: Medicare Other

## 2012-12-29 VITALS — BP 144/80 | HR 66 | Temp 98.1°F | Resp 18 | Ht 62.0 in | Wt 137.5 lb

## 2012-12-29 DIAGNOSIS — C50412 Malignant neoplasm of upper-outer quadrant of left female breast: Secondary | ICD-10-CM

## 2012-12-29 DIAGNOSIS — C50419 Malignant neoplasm of upper-outer quadrant of unspecified female breast: Secondary | ICD-10-CM

## 2012-12-29 DIAGNOSIS — C50912 Malignant neoplasm of unspecified site of left female breast: Secondary | ICD-10-CM

## 2012-12-29 DIAGNOSIS — IMO0001 Reserved for inherently not codable concepts without codable children: Secondary | ICD-10-CM | POA: Insufficient documentation

## 2012-12-29 DIAGNOSIS — C50919 Malignant neoplasm of unspecified site of unspecified female breast: Secondary | ICD-10-CM

## 2012-12-29 DIAGNOSIS — Z170 Estrogen receptor positive status [ER+]: Secondary | ICD-10-CM

## 2012-12-29 DIAGNOSIS — R293 Abnormal posture: Secondary | ICD-10-CM | POA: Insufficient documentation

## 2012-12-29 LAB — CBC WITH DIFFERENTIAL/PLATELET
Basophils Absolute: 0.1 10*3/uL (ref 0.0–0.1)
Eosinophils Absolute: 0.1 10*3/uL (ref 0.0–0.5)
HGB: 13.2 g/dL (ref 11.6–15.9)
NEUT#: 2.9 10*3/uL (ref 1.5–6.5)
RDW: 13.3 % (ref 11.2–14.5)
lymph#: 1.6 10*3/uL (ref 0.9–3.3)

## 2012-12-29 LAB — COMPREHENSIVE METABOLIC PANEL (CC13)
ALT: 15 U/L (ref 0–55)
AST: 20 U/L (ref 5–34)
Albumin: 3.4 g/dL — ABNORMAL LOW (ref 3.5–5.0)
Alkaline Phosphatase: 52 U/L (ref 40–150)
Anion Gap: 10 mEq/L (ref 3–11)
CO2: 26 mEq/L (ref 22–29)
Calcium: 9.8 mg/dL (ref 8.4–10.4)
Chloride: 104 mEq/L (ref 98–109)
Creatinine: 0.7 mg/dL (ref 0.6–1.1)
Potassium: 4.3 mEq/L (ref 3.5–5.1)
Total Protein: 6.8 g/dL (ref 6.4–8.3)

## 2012-12-29 NOTE — Progress Notes (Signed)
Patient ID: Whitney Burns, female   DOB: 16-Jan-1941, 72 y.o.   MRN: [id number]  No chief complaint on file.   HPI Whitney Burns is a 72 y.o. female.  We are asked to see the patient in consultation by Dr. Deboraha Sprang to evaluate her for a left breast cancer. The patient is a 72 year old white female who recently went for a routine screening mammogram. At that time she was found to have an abnormality in the 1:00 position of the left breast. There were actually 2 abnormalities there which were biopsied and came back as grade 1 invasive ductal cancer. They were 3 cm apart and spanned the total area of about 3-1/2 cm. She was ER and PR positive and HER-2 negative. Her Ki-67 was 16% she denied any breast pain. She did not have any discharge from her nipple. She quit taking hormones about 15 years ago. She has no significant family history of breast cancer.  HPI  Past Medical History  Diagnosis Date  . Hyperlipidemia   . Arthritis   . Breast cancer     Past Surgical History  Procedure Laterality Date  . Tonsillectomy and adenoidectomy    . Cholecystectomy    . Tubal ligation    . Colonoscopy  09/03/2011    Procedure: COLONOSCOPY;  Surgeon: Malissa Hippo, MD;  Location: [facility];  Service: Endoscopy;  Laterality: N/A;  730    Family History  Problem Relation Age of Onset  . Pneumonia Mother   . Emphysema Father     Social History History  Substance Use Topics  . Smoking status: Never Smoker   . Smokeless tobacco: Not on file  . Alcohol Use: Yes    No Known Allergies  Current Outpatient Prescriptions  Medication Sig Dispense Refill  . acetaminophen (TYLENOL) 325 MG tablet Take 650 mg by mouth 2 (two) times daily.      Marland Kitchen aspirin EC 81 MG tablet Take 81 mg by mouth daily.      . Calcium Carbonate-Vitamin D (CALCIUM + D PO) Take 1 tablet by mouth daily. Unsure of strength      . fish oil-omega-3 fatty acids 1000 MG capsule Take 1 g by mouth 2 (two) times daily.      . Multiple  Vitamin (MULTIVITAMIN WITH MINERALS) TABS Take 1 tablet by mouth daily.      . pantoprazole (PROTONIX) 40 MG tablet Take 40 mg by mouth 2 (two) times daily.      . polyethylene glycol powder (GLYCOLAX/MIRALAX) powder Take 17 g by mouth daily as needed.        No current facility-administered medications for this visit.    Review of Systems Review of Systems  Constitutional: Negative.   HENT: Negative.   Eyes: Negative.   Respiratory: Negative.   Cardiovascular: Negative.   Gastrointestinal: Negative.   Endocrine: Negative.   Genitourinary: Negative.   Musculoskeletal: Negative.   Skin: Negative.   Allergic/Immunologic: Negative.   Neurological: Negative.   Hematological: Negative.   Psychiatric/Behavioral: Negative.     There were no vitals taken for this visit.  Physical Exam Physical Exam  Constitutional: She is oriented to person, place, and time. She appears well-developed and well-nourished.  HENT:  Head: Normocephalic and atraumatic.  Eyes: Conjunctivae and EOM are normal. Pupils are equal, round, and reactive to light.  Neck: Normal range of motion. Neck supple.  Cardiovascular: Normal rate, regular rhythm and normal heart sounds.   Pulmonary/Chest: Effort normal and breath sounds  normal.  There is a palpable hematoma in the upper portion of the left breast. Otherwise there is no other palpable mass in either breast. There is no palpable axillary, supraclavicular, or cervical lymphadenopathy.  Abdominal: Soft. Bowel sounds are normal. She exhibits no mass. There is no tenderness.  Musculoskeletal: Normal range of motion.  Lymphadenopathy:    She has no cervical adenopathy.  Neurological: She is alert and oriented to person, place, and time.  Skin: Skin is warm and dry.  Psychiatric: She has a normal mood and affect. Her behavior is normal.    Data Reviewed As above  Assessment    The patient appears to have a 3-1/2 cm area of invasive ductal cancer in the  upper left breast. Because of the size of the cancer compared to her small breast size I think she would probably best treated with a mastectomy. I've talked to her in detail about the different options for treatment. At this point she also favors mastectomy. She'll be a good candidate for sentinel node mapping. I discussed with her in detail the risks and benefits of the surgery as well as some of the technical aspects and she understands and wishes to proceed. She is not interested in reconstruction.     Plan    Plan for left mastectomy and sentinel node mapping        TOTH III,Whitney Burns 12/29/2012, 12:15 PM

## 2012-12-29 NOTE — Telephone Encounter (Signed)
appts made and printed...td 

## 2012-12-29 NOTE — Progress Notes (Signed)
ID: KEMBER BOCH OB: Sep 15, 1940  MR#: [id number]  WUJ#:[id number]  PCP: Colette Ribas, MD GYN:   SUFelicity Pellegrini OTHER MD: Chipper Herb  CHIEF COMPLAINT: "I have breast cancer"  HISTORY OF PRESENT ILLNESS: Lucetta had routine screening mammography at [facility] 11/26/2012, showing a possible mass in the left breast. Additional views 12/17/2012 confirmed an irregular density in the upper outer quadrant of the left breast, which was barely palpable as a thickening. Ultrasound confirmed an irregular hypoechoic mass in this area, measuring 1.6 cm. A second, 0.7 cm mass was found in the same quadrant. In the left axilla there was a single lymph node with slightly thickened cortex.  Accordingly on 12/17/2012 the patient underwent biopsy of the 2 breast masses in question as well as the lymph node just described. This showed (SAA [id number]) both the breast masses to consist of identical invasive ductal breast cancers, grade 1, estrogen receptor 100% positive, progesterone receptor 99% positive, with an MIB-1 of 16%, and no HER-2 amplification. Biopsy of the left axillary lymph node was negative.  With this information the patient proceeded to bilateral breast MRI 12/27/2012 at [facility] imaging. This showed the 2 masses in question to be "connected" by an area of abnormality, so that taken together the suspicious area measured 3.0 cm. There were no abnormal appearing lymph nodes and no other areas of concern in either breast.  Her subsequent history is as detailed below  INTERVAL HISTORY: Jonna was seen in the multidisciplinary breast cancer clinic 12/29/2012, accompanied by her husband Kathlene November. Her case was also discussed at the multidisciplinary breast cancer conference that same morning.  REVIEW OF SYSTEMS: There were no specific symptoms leading to her mammography, which was routinely scheduled. Rane has some hot flashes/night sweats, fairly chronic low back pain in other areas of "joint  pain, here in there", but nothing more persistent or intense than before. A detailed review of systems was otherwise noncontributory.  PAST MEDICAL HISTORY: Past Medical History  Diagnosis Date  . Hyperlipidemia   . Arthritis   . Breast cancer   . GERD (gastroesophageal reflux disease)     PAST SURGICAL HISTORY: Past Surgical History  Procedure Laterality Date  . Tonsillectomy and adenoidectomy    . Cholecystectomy    . Tubal ligation    . Colonoscopy  09/03/2011    Procedure: COLONOSCOPY;  Surgeon: Malissa Hippo, MD;  Location: [facility];  Service: Endoscopy;  Laterality: N/A;  730  . Knee surgery      Right knee  . Laminectomy and microdiscectomy spine      FAMILY HISTORY Family History  Problem Relation Age of Onset  . Pneumonia Mother   . Emphysema Father    the patient's father died at the age of 43 from complications of emphysema. The patient's mother died at the age of 2 from pneumonia. The patient was an only child. There is no history of breast or ovarian cancer in the family.  GYNECOLOGIC HISTORY:  Menarche age 68, first live birth age 70, she is GX P2. She went through the change of life approximately 20 years ago. She took hormone replacement for approximately 10 years.  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 Her husband Huey Bienenstock") is retired from working for a Patent attorney. Son Tinnie Gens lives in [place] still [place] and works in Airline pilot. Daughter Yena Tisby Mound lives in [place] and works there as a  broker. The patient has 3 grandchildren. She is a International aid/development worker.   ADVANCED DIRECTIVES: In place   HEALTH MAINTENANCE: History  Substance Use Topics  . Smoking status: Never Smoker   . Smokeless tobacco: Not on file  . Alcohol Use: Yes     Colonoscopy: 2013  PAP: 2013  Bone density: Other [place] [facility] July 2013 showed minimal osteopenia, with a T score of  -1.1  Lipid panel:  No Known Allergies  Current Outpatient Prescriptions  Medication Sig Dispense Refill  . acetaminophen (TYLENOL) 325 MG tablet Take 650 mg by mouth 2 (two) times daily.      Marland Kitchen aspirin EC 81 MG tablet Take 81 mg by mouth daily.      . Calcium Carbonate-Vitamin D (CALCIUM + D PO) Take 1 tablet by mouth daily. Unsure of strength      . fish oil-omega-3 fatty acids 1000 MG capsule Take 1 g by mouth 2 (two) times daily.      . Multiple Vitamin (MULTIVITAMIN WITH MINERALS) TABS Take 1 tablet by mouth daily.      . pantoprazole (PROTONIX) 40 MG tablet Take 40 mg by mouth 2 (two) times daily.      . polyethylene glycol powder (GLYCOLAX/MIRALAX) powder Take 17 g by mouth daily as needed.        No current facility-administered medications for this visit.    OBJECTIVE: Middle-aged white woman in no acute distress Filed Vitals:   12/29/12 0844  BP: 144/80  Pulse: 66  Temp: 98.1 F (36.7 C)  Resp: 18     Body mass index is 25.14 kg/(m^2).    ECOG FS:0 - Asymptomatic  Ocular: Sclerae unicteric, pupils equal, round and reactive to light Ear-nose-throat: Oropharynx clear, dentition fair Lymphatic: No cervical or supraclavicular adenopathy Lungs no rales or rhonchi, good excursion bilaterally Heart regular rate and rhythm, no murmur appreciated Abd soft, nontender, positive bowel sounds MSK no focal spinal tenderness, no joint edema Neuro: non-focal, well-oriented, appropriate affect Breasts: The right breast is status post recent biopsy. There is ecchymosis at the site of the biopsy and slight induration associated with this. There are no skin or nipple changes of concern. The right axilla is benign. The left breast is unremarkable.   LAB RESULTS:  CMP     Component Value Date/Time   NA 139 12/29/2012 0821   K 4.3 12/29/2012 0821   CO2 26 12/29/2012 0821   GLUCOSE 93 12/29/2012 0821   BUN 12.7 12/29/2012 0821   CREATININE 0.7 12/29/2012 0821   CALCIUM 9.8 12/29/2012  0821   PROT 6.8 12/29/2012 0821   ALBUMIN 3.4* 12/29/2012 0821   AST 20 12/29/2012 0821   ALT 15 12/29/2012 0821   ALKPHOS 52 12/29/2012 0821   BILITOT 0.38 12/29/2012 0821    I No results found for this basename: SPEP,  UPEP,   kappa and lambda light chains    Lab Results  Component Value Date   WBC 5.1 12/29/2012   NEUTROABS 2.9 12/29/2012   HGB 13.2 12/29/2012   HCT 38.7 12/29/2012   MCV 92.3 12/29/2012   PLT 313 12/29/2012      Chemistry      Component Value Date/Time   NA 139 12/29/2012 0821   K 4.3 12/29/2012 0821   CO2 26 12/29/2012 0821   BUN 12.7 12/29/2012 0821   CREATININE 0.7 12/29/2012 0821      Component Value Date/Time   CALCIUM 9.8 12/29/2012 0821   ALKPHOS 52 12/29/2012  4098   AST 20 12/29/2012 0821   ALT 15 12/29/2012 0821   BILITOT 0.38 12/29/2012 0821       No results found for this basename: LABCA2    No components found with this basename: LABCA125    No results found for this basename: INR,  in the last 168 hours  Urinalysis No results found for this basename: colorurine,  appearanceur,  labspec,  phurine,  glucoseu,  hgbur,  bilirubinur,  ketonesur,  proteinur,  urobilinogen,  nitrite,  leukocytesur    STUDIES: US Breast Left  12/17/2012   CLINICAL DATA:  The patient returns after screening study for evaluation of the left breast.  EXAM: DIGITAL DIAGNOSTIC  left MAMMOGRAM  ULTRASOUND left BREAST  COMPARISON:  11/26/2012 and earlier  ACR Breast Density Category b: There are scattered areas of fibroglandular density.  FINDINGS: Spot compression views confirm presence of an irregular masslike density in the upper-outer quadrant of the left breast. There are associated microcalcifications, primarily rounded in morphology.  On physical exam I palpate soft thickening in the upper-outer quadrant of the left breast. I palpate no discrete mass however.  Ultrasound is performed, showing an irregular hypoechoic mass in the 1 o'clock location of the left breast, 4 cm  from the nipple. This is associated with internal calcifications and acoustic shadowing. Mass measures 1.6 x 0.9 x 1.1 cm. In the 1 o'clock location, 3 cm from the nipple, a smaller mass has a similar appearance and measures 0.7 x 0.6 x 0.6 cm. Both lesion show increased blood flow on Doppler evaluation. The entire abnormality, inclusive of both lesions is 3.5 cm.  Evaluation of the left axilla shows a single lymph node with slightly thickened cortex, warranting biopsy.  IMPRESSION: 1. Suspicious mass in the 1 o'clock location of the left breast, 4 cm from the nipple. 2. Suspicious mass in the 1 o'clock location of the left breast, 3 cm from the nipple. 3. Suspicious left axillary lymph node.  RECOMMENDATION: Ultrasound-guided core biopsy of the 3 lesions is recommended. The patient request biopsy on the same day. This is performed and dictated separately.  I have discussed the findings and recommendations with the patient. Results were also provided in writing at the conclusion of the visit. If applicable, a reminder letter will be sent to the patient regarding the next appointment.  BI-RADS CATEGORY  4: Suspicious abnormality - biopsy should be considered.   Electronically Signed   By: Rosalie Gums M.D.   On: 12/17/2012 11:00   Mr Breast Bilateral W Wo Contrast  12/27/2012   CLINICAL DATA:  Recently diagnosed left breast cancer following ultrasound-guided biopsies of 2 mass cysts, including that the mass at 1 o'clock position 4 cm from the nipple and a mass at 1 o'clock position 3 cm from the nipple. On ultrasound, the two areas the biopsied spanned approximately 3.5 cm.  EXAM: MR BILATERAL BREAST WITHOUT AND WITH CONTRAST  LABS:  BUN and creatinine were obtained on site at [facility] at  [address]  Results:  BUN 11 mg/dL,  Creatinine 0.7 the mg/dL.  TECHNIQUE: Multiplanar, multisequence MR images of both breasts were obtained prior to and following the intravenous administration of ml of  MultiHance.  THREE-DIMENSIONAL MR IMAGE RENDERING ON INDEPENDENT WORKSTATION:  Three-dimensional MR images were rendered by post-processing of the original MR data on an independent workstation. The three-dimensional MR images were interpreted, and findings are reported in the following complete MRI report for this study.  COMPARISON:  Previous exams  FINDINGS: Breast composition: c:  Heterogeneous fibroglandular tissue  Background parenchymal enhancement: Mild  Right breast: No mass or abnormal enhancement.  Left breast: In the upper-outer quadrant of the left breast, middle 3rd, is an irregular enhancing mass with associated biopsy clip artifact that measures approximate 1.3 x 0.7 x 0.7 cm. Anterior to the larger biopsied mass are several subcentimeter nodular areas of enhancement arranged in a segmental distribution. On MRI suspicious enhancement spans 3.0 cm anterior to posterior diameter by approximately 1.7 cm maximum transverse diameter by 1.5 cm craniocaudal span. The more anteriorly positioned biopsy clip, in the subareolar left breast is along the inferior aspect of suspicious enhancement. Post biopsy hematoma with some associated peripheral enhancement is noted in the subareolar left breast superior to the level of the nipple.  No unsuspected areas of abnormal enhancement are seen in the left breast.  Lymph nodes: No abnormal appearing lymph nodes.  Ancillary findings:  None.  IMPRESSION: 1. Segmental area of enhancement in the upper-outer quadrant of the left breast spans approximately 3.0 cm maximum AP diameter and includes both regions recently biopsied under ultrasound guidance.  2. No evidence of malignancy in the right breast.  RECOMMENDATION: Treatment planning for known left breast cancer.  BI-RADS CATEGORY  6: Known biopsy-proven malignancy - appropriate action should be taken.   Electronically Signed   By: Britta Mccreedy M.D.   On: 12/27/2012 16:39   Mm Digital Diagnostic Unilat L  12/17/2012    CLINICAL DATA:  Status post ultrasound-guided core biopsy of 2 lesions within the left breast, 1 o'clock location.  EXAM: DIGITAL DIAGNOSTIC UNILATERAL LEFT MAMMOGRAM  COMPARISON:  12/17/2012 and earlier  FINDINGS: Mammographic images were obtained following ultrasound guided biopsy of mass in the 1 o'clock location of the left breast, 4 cm from the nipple and 1 o'clock location of the left breast, 3 cm from the nipple. A top hat shaped clip is identified 4 cm from the nipple. A dumbbell-shaped clip is identified 3 cm from the nipple. The 2 clips are approximately 3.1 cm apart. Of note, axillary clip was not placed.  IMPRESSION: Tissue marker clips are in expected location after biopsies.  Final Assessment: Post Procedure Mammograms for Marker Placement   Electronically Signed   By: Rosalie Gums M.D.   On: 12/17/2012 11:17   Mm Digital Diagnostic Unilat L  12/17/2012   CLINICAL DATA:  The patient returns after screening study for evaluation of the left breast.  EXAM: DIGITAL DIAGNOSTIC  left MAMMOGRAM  ULTRASOUND left BREAST  COMPARISON:  11/26/2012 and earlier  ACR Breast Density Category b: There are scattered areas of fibroglandular density.  FINDINGS: Spot compression views confirm presence of an irregular masslike density in the upper-outer quadrant of the left breast. There are associated microcalcifications, primarily rounded in morphology.  On physical exam I palpate soft thickening in the upper-outer quadrant of the left breast. I palpate no discrete mass however.  Ultrasound is performed, showing an irregular hypoechoic mass in the 1 o'clock location of the left breast, 4 cm from the nipple. This is associated with internal calcifications and acoustic shadowing. Mass measures 1.6 x 0.9 x 1.1 cm. In the 1 o'clock location, 3 cm from the nipple, a smaller mass has a similar appearance and measures 0.7 x 0.6 x 0.6 cm. Both lesion show increased blood flow on Doppler evaluation. The entire abnormality,  inclusive of both lesions is 3.5 cm.  Evaluation of the left axilla shows a  single lymph node with slightly thickened cortex, warranting biopsy.  IMPRESSION: 1. Suspicious mass in the 1 o'clock location of the left breast, 4 cm from the nipple. 2. Suspicious mass in the 1 o'clock location of the left breast, 3 cm from the nipple. 3. Suspicious left axillary lymph node.  RECOMMENDATION: Ultrasound-guided core biopsy of the 3 lesions is recommended. The patient request biopsy on the same day. This is performed and dictated separately.  I have discussed the findings and recommendations with the patient. Results were also provided in writing at the conclusion of the visit. If applicable, a reminder letter will be sent to the patient regarding the next appointment.  BI-RADS CATEGORY  4: Suspicious abnormality - biopsy should be considered.   Electronically Signed   By: Rosalie Gums M.D.   On: 12/17/2012 11:00   Mm Radiologist Eval And Mgmt  12/22/2012   *RADIOLOGY REPORT*  ESTABLISHED PATIENT OFFICE VISIT - LEVEL II [phone])  Chief Complaint:  The patient returns for discussion of pathologic findings.  History:  The patient underwent ultrasound-guided core needle biopsy of a mass at 1 o'clock 4 cm from the left nipple and a mass at 1 o'clock 3 cm from the left nipple as well as an abnormal left axillary lymph node.  Exam:  On physical examination, there is a small amount of ecchymosis at the biopsy site.  Each incision site is clean and dry with no sign of hematoma or infection.  Pathology: Each mass in the left breast at 1 o'clock is consistent with invasive ductal carcinoma and ductal carcinoma in situ.  There is no evidence of metastatic involvement of the biopsied lymph node.  Assessment and Plan:  Results were discussed with the patient and her husband.  She reports no complications from the procedures. The patient was scheduled to be evaluated in the [facility] on 12/29/2012.   Breast MRI has been scheduled for 12/27/2012 at 9:15 a.m.  Questions were answered.  Informational materials were given.   Original Report Authenticated By: Cain Saupe, M.D.   [place] Lt Breast Bx W Loc Dev 1st Lesion Img Bx Spec US Guide  12/17/2012   CLINICAL DATA:  The patient has 2 suspicious masses in the 1 o'clock location of the left breast and a suspicious left axillary lymph node.  EXAM: DIAGNOSTIC ULTRASOUND CORE VACUUM ; RADIOLOGY EXAMINATION  COMPARISON:  12/17/2012 and earlier  PROCEDURE: I met with the patient and we discussed the procedure of ultrasound-guided biopsy, including benefits and alternatives. We discussed the high likelihood of a successful procedure. We discussed the risks of the procedure including infection, bleeding, tissue injury, clip migration, and inadequate sampling. Informed written consent was given.  Using sterile technique and 2% Lidocaine as local anesthetic, under direct ultrasound visualization, a 12 gauge vacuum-assisteddevice was used to perform biopsy of nodule in the 1 o'clock location of the left breast, 4 cm from the nipple using a lateral approach. At the conclusion of the procedure, a top hat shaped tissue marker clip was deployed into the biopsy cavity. Follow-up 2-view mammogram was performed and dictated separately.  Using the same technique, a 12 gauge vacuum assisted device was used to perform biopsy of the nodule in the 1 o'clock location of the left breast, 3 cm from the nipple, also using a lateral approach. Following the procedure, a dumbbell-shaped clip was placed at the 2nd biopsy site.  Using sterile technique, 2% lidocaine as local anesthetic, and under direct ultrasound visualization, a  14 gauge spring-loaded device was used to perform biopsy of the left axillary lymph node.  Followup two-view mammogram is performed and is dictated separately.  The usual time-out protocol was performed immediately prior to the procedure.  IMPRESSION:  Ultrasound-guided biopsy of 2 lesions in the 1 o'clock location of the left breast and an enlarged left axillary lymph node. No apparent complications.   Electronically Signed   By: Rosalie Gums M.D.   On: 12/17/2012 11:15   [place] Lt Breast Bx W Loc Dev Ea Add Lesion Img Bx Spec US Guide  12/17/2012   CLINICAL DATA:  The patient has 2 suspicious masses in the 1 o'clock location of the left breast and a suspicious left axillary lymph node.  EXAM: DIAGNOSTIC ULTRASOUND CORE VACUUM ; RADIOLOGY EXAMINATION  COMPARISON:  12/17/2012 and earlier  PROCEDURE: I met with the patient and we discussed the procedure of ultrasound-guided biopsy, including benefits and alternatives. We discussed the high likelihood of a successful procedure. We discussed the risks of the procedure including infection, bleeding, tissue injury, clip migration, and inadequate sampling. Informed written consent was given.  Using sterile technique and 2% Lidocaine as local anesthetic, under direct ultrasound visualization, a 12 gauge vacuum-assisteddevice was used to perform biopsy of nodule in the 1 o'clock location of the left breast, 4 cm from the nipple using a lateral approach. At the conclusion of the procedure, a top hat shaped tissue marker clip was deployed into the biopsy cavity. Follow-up 2-view mammogram was performed and dictated separately.  Using the same technique, a 12 gauge vacuum assisted device was used to perform biopsy of the nodule in the 1 o'clock location of the left breast, 3 cm from the nipple, also using a lateral approach. Following the procedure, a dumbbell-shaped clip was placed at the 2nd biopsy site.  Using sterile technique, 2% lidocaine as local anesthetic, and under direct ultrasound visualization, a 14 gauge spring-loaded device was used to perform biopsy of the left axillary lymph node.  Followup two-view mammogram is performed and is dictated separately.  The usual time-out protocol was performed immediately prior  to the procedure.  IMPRESSION: Ultrasound-guided biopsy of 2 lesions in the 1 o'clock location of the left breast and an enlarged left axillary lymph node. No apparent complications.   Electronically Signed   By: Rosalie Gums M.D.   On: 12/17/2012 11:15   [place] Lt Breast Bx W Loc Dev Ea Add Lesion Img Bx Spec US Guide  12/17/2012   CLINICAL DATA:  The patient has 2 suspicious masses in the 1 o'clock location of the left breast and a suspicious left axillary lymph node.  EXAM: DIAGNOSTIC ULTRASOUND CORE VACUUM ; RADIOLOGY EXAMINATION  COMPARISON:  12/17/2012 and earlier  PROCEDURE: I met with the patient and we discussed the procedure of ultrasound-guided biopsy, including benefits and alternatives. We discussed the high likelihood of a successful procedure. We discussed the risks of the procedure including infection, bleeding, tissue injury, clip migration, and inadequate sampling. Informed written consent was given.  Using sterile technique and 2% Lidocaine as local anesthetic, under direct ultrasound visualization, a 12 gauge vacuum-assisteddevice was used to perform biopsy of nodule in the 1 o'clock location of the left breast, 4 cm from the nipple using a lateral approach. At the conclusion of the procedure, a top hat shaped tissue marker clip was deployed into the biopsy cavity. Follow-up 2-view mammogram was performed and dictated separately.  Using the same technique, a 12 gauge vacuum assisted device was  used to perform biopsy of the nodule in the 1 o'clock location of the left breast, 3 cm from the nipple, also using a lateral approach. Following the procedure, a dumbbell-shaped clip was placed at the 2nd biopsy site.  Using sterile technique, 2% lidocaine as local anesthetic, and under direct ultrasound visualization, a 14 gauge spring-loaded device was used to perform biopsy of the left axillary lymph node.  Followup two-view mammogram is performed and is dictated separately.  The usual time-out protocol  was performed immediately prior to the procedure.  IMPRESSION: Ultrasound-guided biopsy of 2 lesions in the 1 o'clock location of the left breast and an enlarged left axillary lymph node. No apparent complications.   Electronically Signed   By: Rosalie Gums M.D.   On: 12/17/2012 11:15    ASSESSMENT: 72 y.o. [place] woman status post left breast and left axillary lymph node biopsy 12/17/2012 for a clinical T2 pN0, stage IIA invasive ductal carcinoma, grade 1, estrogen receptor 100% positive, progesterone receptor 99% positive, with no HER-2 amplification, and an MIB-1 of 16%  PLAN: We spent the better part of today's hour-long appointment discussing the biology of breast cancer in general, and the specifics of the patient's tumor in particular. Persephone understands that while her tumor is a stage II on the basis of size, it appears to be node negative, and it is slow growing and nonaggressive by grade. She will certainly benefit from antiestrogen therapy in terms of systemic treatment.  The question is whether she would benefit from chemotherapy as well. To help [place] with that decision and we will send an Oncotype DX past. Unfortunately because of insurance reasons we cannot send it for 2 weeks after the surgery, which means we will not have the results until 4 weeks after the surgery. This will delay the patient's plan to move to [place], but she would prefer to have it all "planned out here", even if it and stopped that she will need chemotherapy (which she would then receive in or near [place], [place]).  Accordingly I have made her a return appointment here for mid December 2014. She knows to call for any problems that may develop before that visit.      Lowella Dell, MD   12/29/2012 12:35 PM

## 2012-12-29 NOTE — Progress Notes (Signed)
[facility] Radiation Oncology NEW PATIENT EVALUATION  Name: Whitney Burns MRN: [id number]  Date:   12/29/2012           DOB: 1940-10-27  Status: outpatient   CC: Whitney Ribas, MD  Whitney Askew, MD    REFERRING PHYSICIAN: Robyne Askew, MD   DIAGNOSIS: Clinical stage I (T1, N0, M0) versus clinical stage IIA (T2 N0 M0) invasive ductal/DCIS of the left breast   HISTORY OF PRESENT ILLNESS:  Whitney Burns is a 72 y.o. female who is seen today for the courtesy Dr. Carolynne Burns for discussion of possible radiation therapy in the management of her T1 versus T2 invasive ductal/DCIS of the left breast. At the time of a screening mammogram at [facility] on 11/26/2012 she was felt to have a possible mass within the left breast. Additional views and ultrasound showed 2 masses at 1:00, one 4 cm from the nipple and the other 3 cm from the nipple. These were nonpalpable. There was also felt to be a suspicious left axillary lymph node. She underwent ultrasound-guided biopsies on 12/17/2012 which were diagnostic for invasive ductal carcinoma along with DCIS with calcifications at both the 1:00 locations. The suspicious axillary lymph node was negative. Her tumor was strongly ER positive at 100%, PR positive at 99% with a proliferation marker of 16%. HER-2/neu was negative. Breast MR on 11/26/2012 showed a segmental area of enhancement in the upper outer quadrant of the left breast approximately 3.0 cm in greatest dimensions. There is no axillary adenopathy appreciated. She is getting ready to retire in [place]. She seen today with Dr. Carolynne Burns and Dr. Darnelle Burns.  PREVIOUS RADIATION THERAPY: No   PAST MEDICAL HISTORY:  has a past medical history of Hyperlipidemia; Arthritis; and Breast cancer.     PAST SURGICAL HISTORY:  Past Surgical History  Procedure Laterality Date  . Tonsillectomy and adenoidectomy    . Cholecystectomy    . Tubal ligation    . Colonoscopy  09/03/2011   Procedure: COLONOSCOPY;  Surgeon: Malissa Hippo, MD;  [facility];  Service: Endoscopy;  Laterality: N/A;  730     FAMILY HISTORY: family history includes Emphysema in her father; Pneumonia in her mother. Her mother died from complications of pneumonia and 95. Her father died from complications of COPD at 75. No family history breast cancer.   SOCIAL HISTORY:  reports that she has never smoked. She does not have any smokeless tobacco history on file. She reports that she drinks alcohol. She reports that she does not use illicit drugs. Married, 2 children. She worked at the [employer] for [employer].   ALLERGIES: Review of patient's allergies indicates no known allergies.   MEDICATIONS:  Current Outpatient Prescriptions  Medication Sig Dispense Refill  . aspirin EC 81 MG tablet Take 81 mg by mouth daily.      . Calcium Carbonate-Vitamin D (CALCIUM + D PO) Take 1 tablet by mouth daily. Unsure of strength      . fish oil-omega-3 fatty acids 1000 MG capsule Take 1 g by mouth 2 (two) times daily.      Marland Kitchen GLUCOSAMINE PO Take 2 tablets by mouth daily. Unknown strength      . meloxicam (MOBIC) 15 MG tablet Take 15 mg by mouth daily.      . Multiple Vitamin (MULTIVITAMIN WITH MINERALS) TABS Take 1 tablet by mouth daily.      Marland Kitchen omeprazole (PRILOSEC) 40 MG  capsule Take 40 mg by mouth daily.      . polyethylene glycol powder (GLYCOLAX/MIRALAX) powder Take 17 g by mouth daily.      . promethazine (PHENERGAN) 25 MG tablet Take 12.5 mg by mouth daily as needed. For sleep       No current facility-administered medications for this encounter.     REVIEW OF SYSTEMS:  Pertinent items are noted in HPI.    PHYSICAL EXAM:  Alert and oriented 72 year old white female appearing younger than her stated age. Wt Readings from Last 3 Encounters:  12/29/12 137 lb 8 oz (62.37 kg)  09/03/11 132 lb (59.875 kg)  09/03/11 132 lb (59.875 kg)   Temp Readings from Last 3  Encounters:  12/29/12 98.1 F (36.7 C) Oral  09/03/11 98 F (36.7 C) Oral  09/03/11 98 F (36.7 C) Oral   BP Readings from Last 3 Encounters:  12/29/12 144/80  09/03/11 131/69  09/03/11 131/69   Pulse Readings from Last 3 Encounters:  12/29/12 66  09/03/11 65  09/03/11 65   Head and neck examination: Grossly unremarkable. Nodes: Without palpable cervical, supraclavicular, or axillary lymphadenopathy. Chest: Lungs clear. Heart: Regular rate and rhythm. Breasts: There is a bruise at approximately 11:00 along the left breast with what appears to be a small organizing hematoma measuring approximately 3 cm.. No other masses are appreciated. Right breast without masses or lesions. Abdomen: Without hepatomegaly. Extremities: Without edema.    LABORATORY DATA:  Lab Results  Component Value Date   WBC 5.1 12/29/2012   HGB 13.2 12/29/2012   HCT 38.7 12/29/2012   MCV 92.3 12/29/2012   PLT 313 12/29/2012   Lab Results  Component Value Date   NA 139 12/29/2012   K 4.3 12/29/2012   CO2 26 12/29/2012   Lab Results  Component Value Date   ALT 15 12/29/2012   AST 20 12/29/2012   ALKPHOS 52 12/29/2012   BILITOT 0.38 12/29/2012      IMPRESSION: Clinical stage I versus stage IIA (T1 versus T2 N0 M0) invasive ductal/DCIS of the left breast. I explained to the patient and her husband that her local treatment options include mastectomy versus partial mastectomy followed by radiation therapy with or without neoadjuvant hormone therapy. Dr. Carolynne Burns feels that her tumor size is large relative to her breast size, and therefore cosmesis may be compromised with breast preservation unless she undergoes neoadjuvant hormone therapy. We discussed the potential acute and late toxicities of radiation therapy should she choose conservative surgery. After considerable thought, she prefers to have a mastectomy and a sentinel lymph node biopsy,  then antiestrogen therapy. I supported her decision.   PLAN: As discussed  above.  I spent 30 minutes minutes face to face with the patient and more than 50% of that time was spent in counseling and/or coordination of care.

## 2012-12-29 NOTE — Progress Notes (Signed)
Checked in new patient with no financial issues. She has her appt card and [facility] packet. She has not traveled to [place].

## 2013-01-03 ENCOUNTER — Encounter: Payer: Self-pay | Admitting: *Deleted

## 2013-01-03 NOTE — Progress Notes (Signed)
[facility] Psychosocial Distress Screening Clinical Social Work  Patient completed distress screening protocol.  The patient scored a 6 on the Psychosocial Distress Thermometer which indicates moderate distress. Clinical Social Worker met with pt in [facility] to assess for distress and other psychosocial needs.  Pt stated her distress level had decreased after meeting with the physicians and getting more information on her treatment plan.  CSW informed pt of the support team and support services at [facility].  Pt was appreciative of the information, but declined an alight guides referral at this time.  CSW encouraged pt to call with any additional questions or concerns.     Tamala Julian, MSW, LCSW Clinical Social Worker [facility] [phone]

## 2013-01-05 ENCOUNTER — Encounter (HOSPITAL_COMMUNITY): Payer: Self-pay | Admitting: Pharmacy Technician

## 2013-01-06 ENCOUNTER — Encounter (HOSPITAL_COMMUNITY)
Admission: RE | Admit: 2013-01-06 | Discharge: 2013-01-06 | Disposition: A | Discharge status: Home / Self Care | Payer: Medicare Other | Source: Ambulatory Visit | Attending: General Surgery | Admitting: General Surgery

## 2013-01-06 ENCOUNTER — Encounter (HOSPITAL_COMMUNITY): Payer: Self-pay

## 2013-01-06 DIAGNOSIS — Z01812 Encounter for preprocedural laboratory examination: Secondary | ICD-10-CM | POA: Insufficient documentation

## 2013-01-06 HISTORY — DX: Effusion, unspecified joint: M25.40

## 2013-01-06 HISTORY — DX: Unspecified cataract: H26.9

## 2013-01-06 HISTORY — DX: Pain in unspecified joint: M25.50

## 2013-01-06 NOTE — Pre-Procedure Instructions (Signed)
Everly P Rodriques  01/06/2013   Your procedure is scheduled on:  Mon, Nov 17 @ 10:00 AM  Report to [facility] Stay Entrance A at 8:00 AM.  Call this number if you have problems the morning of surgery: [phone]   Remember:   Do not eat food or drink liquids after midnight.   Take these medicines the morning of surgery with A SIP OF WATER: Pantoprazole(Protonix)               Stop taking your Fish Oil and Aspirin.No Goody's,BC's,Aleve,Ibuprofen,or any Herbal Medications   Do not wear jewelry, make-up or nail polish.  Do not wear lotions, powders, or perfumes. You may wear deodorant.  Do not shave 48 hours prior to surgery.   Do not bring valuables to the hospital.  [facility] is not responsible                  for any belongings or valuables.               Contacts, dentures or bridgework may not be worn into surgery.  Leave suitcase in the car. After surgery it may be brought to your room.  For patients admitted to the hospital, discharge time is determined by your                treatment team.               Patients discharged the day of surgery will not be allowed to drive  home.    Special Instructions: Shower using CHG 2 nights before surgery and the night before surgery.  If you shower the day of surgery use CHG.  Use special wash - you have one bottle of CHG for all showers.  You should use approximately 1/3 of the bottle for each shower.   Please read over the following fact sheets that you were given: Pain Booklet, Coughing and Deep Breathing and Surgical Site Infection Prevention

## 2013-01-09 MED ORDER — CEFAZOLIN SODIUM-DEXTROSE 2-3 GM-% IV SOLR
2.0000 g | INTRAVENOUS | Status: AC
  Administered 2013-01-10: 2 g via INTRAVENOUS
  Filled 2013-01-09: qty 50

## 2013-01-10 ENCOUNTER — Ambulatory Visit (HOSPITAL_COMMUNITY)
Admission: RE | Admit: 2013-01-10 | Discharge: 2013-01-10 | Disposition: A | Discharge status: Home / Self Care | Payer: Medicare Other | Source: Ambulatory Visit | Attending: General Surgery | Admitting: General Surgery

## 2013-01-10 ENCOUNTER — Encounter: Payer: Self-pay | Admitting: *Deleted

## 2013-01-10 ENCOUNTER — Ambulatory Visit (HOSPITAL_COMMUNITY): Payer: Medicare Other | Admitting: Anesthesiology

## 2013-01-10 ENCOUNTER — Observation Stay (HOSPITAL_COMMUNITY)
Admission: RE | Admit: 2013-01-10 | Discharge: 2013-01-11 | Disposition: A | Discharge status: Home / Self Care | Payer: Medicare Other | Source: Ambulatory Visit | Attending: General Surgery | Admitting: General Surgery

## 2013-01-10 ENCOUNTER — Encounter (HOSPITAL_COMMUNITY): Payer: Medicare Other | Admitting: Anesthesiology

## 2013-01-10 ENCOUNTER — Encounter (HOSPITAL_COMMUNITY)
Admission: RE | Disposition: A | Discharge status: Home / Self Care | Payer: Self-pay | Source: Ambulatory Visit | Attending: General Surgery

## 2013-01-10 ENCOUNTER — Encounter (HOSPITAL_COMMUNITY): Payer: Self-pay | Admitting: Anesthesiology

## 2013-01-10 DIAGNOSIS — C773 Secondary and unspecified malignant neoplasm of axilla and upper limb lymph nodes: Secondary | ICD-10-CM | POA: Insufficient documentation

## 2013-01-10 DIAGNOSIS — C50412 Malignant neoplasm of upper-outer quadrant of left female breast: Secondary | ICD-10-CM

## 2013-01-10 DIAGNOSIS — E785 Hyperlipidemia, unspecified: Secondary | ICD-10-CM | POA: Insufficient documentation

## 2013-01-10 DIAGNOSIS — C50912 Malignant neoplasm of unspecified site of left female breast: Secondary | ICD-10-CM

## 2013-01-10 DIAGNOSIS — C50919 Malignant neoplasm of unspecified site of unspecified female breast: Principal | ICD-10-CM | POA: Insufficient documentation

## 2013-01-10 DIAGNOSIS — D0590 Unspecified type of carcinoma in situ of unspecified breast: Secondary | ICD-10-CM

## 2013-01-10 HISTORY — PX: BREAST SURGERY: SHX581

## 2013-01-10 HISTORY — PX: MASTECTOMY W/ SENTINEL NODE BIOPSY: SHX2001

## 2013-01-10 SURGERY — MASTECTOMY WITH SENTINEL LYMPH NODE BIOPSY
Anesthesia: General | Site: Breast | Laterality: Left | Wound class: Clean

## 2013-01-10 MED ORDER — FENTANYL CITRATE 0.05 MG/ML IJ SOLN
INTRAMUSCULAR | Status: DC | PRN
  Administered 2013-01-10: 25 ug via INTRAVENOUS
  Administered 2013-01-10 (×2): 50 ug via INTRAVENOUS
  Administered 2013-01-10: 25 ug via INTRAVENOUS
  Administered 2013-01-10: 50 ug via INTRAVENOUS
  Administered 2013-01-10 (×2): 25 ug via INTRAVENOUS

## 2013-01-10 MED ORDER — ARTIFICIAL TEARS OP OINT
TOPICAL_OINTMENT | OPHTHALMIC | Status: DC | PRN
  Administered 2013-01-10: 1 via OPHTHALMIC

## 2013-01-10 MED ORDER — MIDAZOLAM HCL 2 MG/2ML IJ SOLN
INTRAMUSCULAR | Status: AC
  Administered 2013-01-10: 2 mg
  Filled 2013-01-10: qty 2

## 2013-01-10 MED ORDER — OXYCODONE HCL 5 MG PO TABS: 5.0000 mg | ORAL_TABLET | Freq: Once | ORAL | Status: DC | PRN

## 2013-01-10 MED ORDER — FENTANYL CITRATE 0.05 MG/ML IJ SOLN
INTRAMUSCULAR | Status: AC
  Administered 2013-01-10: 100 ug
  Filled 2013-01-10: qty 2

## 2013-01-10 MED ORDER — LIDOCAINE HCL (CARDIAC) 20 MG/ML IV SOLN
INTRAVENOUS | Status: DC | PRN
  Administered 2013-01-10: 100 mg via INTRAVENOUS

## 2013-01-10 MED ORDER — OXYCODONE-ACETAMINOPHEN 5-325 MG PO TABS
1.0000 | ORAL_TABLET | ORAL | Status: DC | PRN
  Administered 2013-01-10 – 2013-01-11 (×2): 2 via ORAL
  Filled 2013-01-10 (×2): qty 2

## 2013-01-10 MED ORDER — HYDROMORPHONE HCL PF 1 MG/ML IJ SOLN
INTRAMUSCULAR | Status: AC
  Filled 2013-01-10: qty 1

## 2013-01-10 MED ORDER — LACTATED RINGERS IV SOLN
INTRAVENOUS | Status: DC | PRN
  Administered 2013-01-10 (×2): via INTRAVENOUS

## 2013-01-10 MED ORDER — PROPOFOL 10 MG/ML IV BOLUS
INTRAVENOUS | Status: DC | PRN
  Administered 2013-01-10: 200 mg via INTRAVENOUS

## 2013-01-10 MED ORDER — SODIUM CHLORIDE 0.9 % IJ SOLN
INTRAMUSCULAR | Status: AC
  Filled 2013-01-10: qty 10

## 2013-01-10 MED ORDER — HYDROMORPHONE HCL PF 1 MG/ML IJ SOLN
0.2500 mg | INTRAMUSCULAR | Status: DC | PRN
  Administered 2013-01-10 (×2): 0.5 mg via INTRAVENOUS

## 2013-01-10 MED ORDER — ONDANSETRON HCL 4 MG PO TABS: 4.0000 mg | ORAL_TABLET | Freq: Four times a day (QID) | ORAL | Status: DC | PRN

## 2013-01-10 MED ORDER — CHLORHEXIDINE GLUCONATE 4 % EX LIQD: 1.0000 "application " | Freq: Once | CUTANEOUS | Status: DC

## 2013-01-10 MED ORDER — MORPHINE SULFATE 4 MG/ML IJ SOLN
4.0000 mg | INTRAMUSCULAR | Status: DC | PRN
  Administered 2013-01-10: 4 mg via INTRAVENOUS
  Filled 2013-01-10: qty 1

## 2013-01-10 MED ORDER — KCL IN DEXTROSE-NACL 20-5-0.9 MEQ/L-%-% IV SOLN
INTRAVENOUS | Status: DC
  Administered 2013-01-10 – 2013-01-11 (×2): via INTRAVENOUS
  Filled 2013-01-10 (×3): qty 1000

## 2013-01-10 MED ORDER — METHYLENE BLUE 1 % INJ SOLN
INTRAMUSCULAR | Status: AC
  Filled 2013-01-10: qty 10

## 2013-01-10 MED ORDER — GLYCOPYRROLATE 0.2 MG/ML IJ SOLN
INTRAMUSCULAR | Status: DC | PRN
  Administered 2013-01-10: 0.2 mg via INTRAVENOUS

## 2013-01-10 MED ORDER — BUPIVACAINE-EPINEPHRINE PF 0.25-1:200000 % IJ SOLN
INTRAMUSCULAR | Status: AC
  Filled 2013-01-10: qty 30

## 2013-01-10 MED ORDER — SODIUM CHLORIDE 0.9 % IJ SOLN
INTRAMUSCULAR | Status: DC | PRN
  Administered 2013-01-10: 11:00:00 via INTRAMUSCULAR

## 2013-01-10 MED ORDER — TECHNETIUM TC 99M SULFUR COLLOID FILTERED
1.0000 | Freq: Once | INTRAVENOUS | Status: AC | PRN
  Administered 2013-01-10: 1 via INTRADERMAL

## 2013-01-10 MED ORDER — ONDANSETRON HCL 4 MG/2ML IJ SOLN: 4.0000 mg | Freq: Four times a day (QID) | INTRAMUSCULAR | Status: DC | PRN

## 2013-01-10 MED ORDER — 0.9 % SODIUM CHLORIDE (POUR BTL) OPTIME
TOPICAL | Status: DC | PRN
  Administered 2013-01-10: 1000 mL

## 2013-01-10 MED ORDER — PANTOPRAZOLE SODIUM 40 MG PO TBEC
40.0000 mg | DELAYED_RELEASE_TABLET | Freq: Two times a day (BID) | ORAL | Status: DC
  Administered 2013-01-10 – 2013-01-11 (×2): 40 mg via ORAL
  Filled 2013-01-10 (×2): qty 1

## 2013-01-10 MED ORDER — DOCUSATE SODIUM 100 MG PO CAPS
100.0000 mg | ORAL_CAPSULE | Freq: Two times a day (BID) | ORAL | Status: DC
  Administered 2013-01-10 – 2013-01-11 (×2): 100 mg via ORAL
  Filled 2013-01-10 (×2): qty 1

## 2013-01-10 MED ORDER — HEPARIN SODIUM (PORCINE) 5000 UNIT/ML IJ SOLN
5000.0000 [IU] | Freq: Three times a day (TID) | INTRAMUSCULAR | Status: DC
  Filled 2013-01-10 (×2): qty 1

## 2013-01-10 MED ORDER — OXYCODONE HCL 5 MG/5ML PO SOLN: 5.0000 mg | Freq: Once | ORAL | Status: DC | PRN

## 2013-01-10 MED ORDER — ONDANSETRON HCL 4 MG/2ML IJ SOLN
INTRAMUSCULAR | Status: DC | PRN
  Administered 2013-01-10 (×2): 4 mg via INTRAVENOUS

## 2013-01-10 SURGICAL SUPPLY — 55 items
ADH SKN CLS APL DERMABOND .7 (GAUZE/BANDAGES/DRESSINGS) ×1
APPLIER CLIP 9.375 MED OPEN (MISCELLANEOUS) ×4
APR CLP MED 9.3 20 MLT OPN (MISCELLANEOUS) ×2
BANDAGE ELASTIC 6 VELCRO ST LF (GAUZE/BANDAGES/DRESSINGS) ×1 IMPLANT
BINDER BREAST LRG (GAUZE/BANDAGES/DRESSINGS) ×1 IMPLANT
BINDER BREAST XLRG (GAUZE/BANDAGES/DRESSINGS) IMPLANT
CANISTER SUCTION 2500CC (MISCELLANEOUS) ×2 IMPLANT
CHLORAPREP W/TINT 26ML (MISCELLANEOUS) ×2 IMPLANT
CLIP APPLIE 9.375 MED OPEN (MISCELLANEOUS) IMPLANT
CONT SPEC 4OZ CLIKSEAL STRL BL (MISCELLANEOUS) ×6 IMPLANT
COVER PROBE W GEL 5X96 (DRAPES) ×2 IMPLANT
COVER SURGICAL LIGHT HANDLE (MISCELLANEOUS) ×2 IMPLANT
DERMABOND ADVANCED (GAUZE/BANDAGES/DRESSINGS) ×1
DERMABOND ADVANCED .7 DNX12 (GAUZE/BANDAGES/DRESSINGS) ×1 IMPLANT
DRAIN CHANNEL 19F RND (DRAIN) ×3 IMPLANT
DRAPE LAPAROSCOPIC ABDOMINAL (DRAPES) ×2 IMPLANT
DRAPE UTILITY 15X26 W/TAPE STR (DRAPE) ×4 IMPLANT
DRSG PAD ABDOMINAL 8X10 ST (GAUZE/BANDAGES/DRESSINGS) ×2 IMPLANT
ELECT CAUTERY BLADE 6.4 (BLADE) ×2 IMPLANT
ELECT REM PT RETURN 9FT ADLT (ELECTROSURGICAL) ×2
ELECTRODE REM PT RTRN 9FT ADLT (ELECTROSURGICAL) ×1 IMPLANT
EVACUATOR SILICONE 100CC (DRAIN) ×3 IMPLANT
GAUZE XEROFORM 5X9 LF (GAUZE/BANDAGES/DRESSINGS) ×1 IMPLANT
GLOVE BIO SURGEON STRL SZ 6 (GLOVE) ×2 IMPLANT
GLOVE BIO SURGEON STRL SZ7.5 (GLOVE) ×2 IMPLANT
GLOVE BIOGEL PI IND STRL 6.5 (GLOVE) IMPLANT
GLOVE BIOGEL PI INDICATOR 6.5 (GLOVE) ×5
GLOVE SURG SS PI 6.5 STRL IVOR (GLOVE) ×1 IMPLANT
GOWN STRL NON-REIN LRG LVL3 (GOWN DISPOSABLE) ×4 IMPLANT
GOWN STRL REIN XL XLG (GOWN DISPOSABLE) ×2 IMPLANT
KIT BASIN OR (CUSTOM PROCEDURE TRAY) ×2 IMPLANT
KIT ROOM TURNOVER OR (KITS) ×2 IMPLANT
NDL 18GX1X1/2 (RX/OR ONLY) (NEEDLE) ×1 IMPLANT
NDL HYPO 25GX1X1/2 BEV (NEEDLE) ×1 IMPLANT
NEEDLE 18GX1X1/2 (RX/OR ONLY) (NEEDLE) ×2 IMPLANT
NEEDLE HYPO 25GX1X1/2 BEV (NEEDLE) ×2 IMPLANT
NS IRRIG 1000ML POUR BTL (IV SOLUTION) ×2 IMPLANT
PACK GENERAL/GYN (CUSTOM PROCEDURE TRAY) ×2 IMPLANT
PAD ARMBOARD 7.5X6 YLW CONV (MISCELLANEOUS) ×4 IMPLANT
SHEARS HARMONIC 9CM CVD (BLADE) ×1 IMPLANT
SPECIMEN JAR LARGE (MISCELLANEOUS) ×2 IMPLANT
SPONGE GAUZE 4X4 12PLY (GAUZE/BANDAGES/DRESSINGS) ×2 IMPLANT
SUT ETHILON 3 0 FSL (SUTURE) ×3 IMPLANT
SUT MNCRL AB 3-0 PS2 18 (SUTURE) ×2 IMPLANT
SUT MON AB 4-0 PC3 18 (SUTURE) ×2 IMPLANT
SUT VIC AB 3-0 54X BRD REEL (SUTURE) ×1 IMPLANT
SUT VIC AB 3-0 BRD 54 (SUTURE) ×2
SUT VIC AB 3-0 SH 18 (SUTURE) ×3 IMPLANT
SUT VIC AB 3-0 SH 27 (SUTURE) ×2
SUT VIC AB 3-0 SH 27XBRD (SUTURE) ×1 IMPLANT
SYR CONTROL 10ML LL (SYRINGE) ×2 IMPLANT
TAPE CLOTH SURG 4X10 WHT LF (GAUZE/BANDAGES/DRESSINGS) ×1 IMPLANT
TOWEL OR 17X24 6PK STRL BLUE (TOWEL DISPOSABLE) ×2 IMPLANT
TOWEL OR 17X26 10 PK STRL BLUE (TOWEL DISPOSABLE) ×2 IMPLANT
WATER STERILE IRR 1000ML POUR (IV SOLUTION) IMPLANT

## 2013-01-10 NOTE — Anesthesia Postprocedure Evaluation (Signed)
Anesthesia Post Note  Patient: Whitney Burns  Procedure(s) Performed: Procedure(s) (LRB): MASTECTOMY WITH SENTINEL LYMPH NODE BIOPSY (Left)  Anesthesia type: General  Patient location: PACU  Post pain: Pain level controlled and Adequate analgesia  Post assessment: Post-op Vital signs reviewed, Patient's Cardiovascular Status Stable, Respiratory Function Stable, Patent Airway and Pain level controlled  Last Vitals:  Filed Vitals:   01/10/13 1435  BP: 127/62  Pulse: 68  Temp:   Resp: 11    Post vital signs: Reviewed and stable  Level of consciousness: awake, alert  and oriented  Complications: No apparent anesthesia complications

## 2013-01-10 NOTE — Progress Notes (Signed)
Mailed after appt letter to pt. 

## 2013-01-10 NOTE — Interval H&P Note (Signed)
History and Physical Interval Note:  01/10/2013 9:17 AM  Whitney Burns  has presented today for surgery, with the diagnosis of left breast cancer  The various methods of treatment have been discussed with the patient and family. After consideration of risks, benefits and other options for treatment, the patient has consented to  Procedure(s): MASTECTOMY WITH SENTINEL LYMPH NODE BIOPSY (Left) as a surgical intervention .  The patient's history has been reviewed, patient examined, no change in status, stable for surgery.  I have reviewed the patient's chart and labs.  Questions were answered to the patient's satisfaction.     TOTH III,PAUL S

## 2013-01-10 NOTE — Op Note (Signed)
01/10/2013  1:05 PM  PATIENT:  Whitney Burns  72 y.o. female  PRE-OPERATIVE DIAGNOSIS:  LEFT BREAST CANCER  POST-OPERATIVE DIAGNOSIS:  LEFT BREAST CANCER  PROCEDURE:  Procedure(s): MASTECTOMY WITH SENTINEL LYMPH NODE BIOPSY (Left) Left axillary lymph node dissection  SURGEON:  Surgeon(s) and Role:    * Robyne Askew, MD - Primary    * Almond Lint, MD - Assisting    * Almond Lint, MD - Assisting  PHYSICIAN ASSISTANT:   ASSISTANTS: Dr. Donell Beers   ANESTHESIA:   general  EBL:  Total I/O In: 1000 [I.V.:1000] Out: 250 [Urine:200; Blood:50]  BLOOD ADMINISTERED:none  DRAINS: (2) Jackson-Pratt drain(s) with closed bulb suction in the prepectoral space   LOCAL MEDICATIONS USED:  NONE  SPECIMEN:  Source of Specimen:  left breast and sentinel nodes with left axillary contents  DISPOSITION OF SPECIMEN:  PATHOLOGY  COUNTS:  YES  TOURNIQUET:  * No tourniquets in log *  DICTATION: .Dragon Dictation After informed consent was obtained patient was brought to the operating room placed in the supine position on the operating table. After adequate induction of general anesthesia the patient's left chest, breast, and axilla were prepped with ChloraPrep, allowed to dry, and draped in usual sterile manner. Earlier in the day the patient underwent injection of 1 mCi of technetium sulfur colloid in the subareolar position. At this point, 2 cc of methylene blue and 3 cc of injectable saline were also injected in the subareolar position on the left. The breast was massaged for several minutes. An elliptical incision was mapped out around the nipple and areola complex to minimize the excess skin. An incision was made along these lines with a 10 blade knife. This incision was carried through the skin and subcutaneous tissue sharply with electrocautery. Breast hooks were used to then elevated the skin flaps anteriorly towards the ceiling. Then skin flaps were created between the breast tissue in the  subcutaneous fat. This dissection was carried out circumferentially around the incision until the dissection reached the muscle of the chest wall. Once the dissection was carried out laterally to the chest wall the neoprobe was used to identify a hot spot in the left axilla. Dissection was carried into the left axilla with the electrocautery. Using the neoprobe to direct blunt hemostat dissection we were able to identify 6 hot blue lymph nodes that were removed sharply with the Bovie electrocautery or with the harmonic scalpel. Ex vivo counts on these sentinel node was ranged from [phone]. One of these lymph nodes was neither hot nor blue but was palpable. These nodes were sent to pathology for further evaluation. While this was occurring we then removed the breast from the pectoralis muscle of the chest wall with the pectoralis fascia. This dissection was also done sharply with the electrocautery. Once the breast was removed it was oriented with a stitch on the lateral aspect and sent to pathology for further evaluation. At this point the first sentinel node was positive. We then identified the latissimus muscle posteriorly, the serratus muscle of the chest wall medially, and the axillary vein superiorly. The fatty and lymph node contents within these boundaries was then dissected free bluntly with a right angle clamp. The long thoracic and direct dorsal neurovascular complexes were identified and spared. Once these contents were removed they were sent to pathology as left axillary contents. Hemostasis was achieved using the Bovie electrocautery. The wound was irrigated with copious amounts of saline. 2 small stab incisions were then made  near the anterior axillary line on the left lobe of the operative bed. A tonsil clamp was then placed through each of these openings into the operative bed and used to bring a 19 [place] round Blake drain into the operative bed. The medial drain was brought along the chest wall.  The lateral drain was placed in the axilla. Both drains were anchored to the skin with 3-0 nylon stitches. The superior and inferior skin flaps were then grossly reapproximated with interrupted 3-0 Vicryl stitches. The skin was then closed around a 4-0 Monocryl subcuticular stitch. Dermabond dressings and sterile dressings were applied. The patient tolerated the procedure well. At the end of the case all needle sponge and instrument counts were correct. The drains were placed to bulb suction and there was a good seal. The patient was then awakened and taken to recovery in stable condition.  PLAN OF CARE: Admit for overnight observation  PATIENT DISPOSITION:  PACU - hemodynamically stable.   Delay start of Pharmacological VTE agent (>24hrs) due to surgical blood loss or risk of bleeding: not applicable

## 2013-01-10 NOTE — Transfer of Care (Signed)
Immediate Anesthesia Transfer of Care Note  Patient: Whitney Burns  Procedure(s) Performed: Procedure(s): MASTECTOMY WITH SENTINEL LYMPH NODE BIOPSY (Left)  Patient Location: PACU  Anesthesia Type:General  Level of Consciousness: sedated  Airway & Oxygen Therapy: Patient Spontanous Breathing and Patient connected to nasal cannula oxygen  Post-op Assessment: Report given to PACU RN and Post -op Vital signs reviewed and stable  Post vital signs: stable  Complications: No apparent anesthesia complications

## 2013-01-10 NOTE — Anesthesia Preprocedure Evaluation (Signed)
Anesthesia Evaluation  Patient identified by MRN, date of birth, ID band Patient awake    Reviewed: Allergy & Precautions, H&P , NPO status , Patient's Chart, lab work & pertinent test results  Airway Mallampati: II  Neck ROM: full    Dental   Pulmonary former smoker,          Cardiovascular negative cardio ROS      Neuro/Psych    GI/Hepatic GERD-  ,  Endo/Other    Renal/GU      Musculoskeletal  (+) Arthritis -,   Abdominal   Peds  Hematology   Anesthesia Other Findings   Reproductive/Obstetrics                           Anesthesia Physical Anesthesia Plan  ASA: II  Anesthesia Plan: General   Post-op Pain Management:    Induction: Intravenous  Airway Management Planned: LMA  Additional Equipment:   Intra-op Plan:   Post-operative Plan:   Informed Consent: I have reviewed the patients History and Physical, chart, labs and discussed the procedure including the risks, benefits and alternatives for the proposed anesthesia with the patient or authorized representative who has indicated his/her understanding and acceptance.     Plan Discussed with: CRNA, Anesthesiologist and Surgeon  Anesthesia Plan Comments:         Anesthesia Quick Evaluation

## 2013-01-10 NOTE — H&P (View-Only) (Signed)
Patient ID: Whitney Burns, female   DOB: 06/29/1940, 72 y.o.   MRN: [id number]  No chief complaint on file.   HPI Whitney Burns is a 72 y.o. female.  We are asked to see the patient in consultation by Dr. Eagle to evaluate her for a left breast cancer. The patient is a 72-year-old white female who recently went for a routine screening mammogram. At that time she was found to have an abnormality in the 1:00 position of the left breast. There were actually 2 abnormalities there which were biopsied and came back as grade 1 invasive ductal cancer. They were 3 cm apart and spanned the total area of about 3-1/2 cm. She was ER and PR positive and HER-2 negative. Her Ki-67 was 16% she denied any breast pain. She did not have any discharge from her nipple. She quit taking hormones about 15 years ago. She has no significant family history of breast cancer.  HPI  Past Medical History  Diagnosis Date  . Hyperlipidemia   . Arthritis   . Breast cancer     Past Surgical History  Procedure Laterality Date  . Tonsillectomy and adenoidectomy    . Cholecystectomy    . Tubal ligation    . Colonoscopy  09/03/2011    Procedure: COLONOSCOPY;  Surgeon: Najeeb U Rehman, MD;  [facility];  Service: Endoscopy;  Laterality: N/A;  730    Family History  Problem Relation Age of Onset  . Pneumonia Mother   . Emphysema Father     Social History History  Substance Use Topics  . Smoking status: Never Smoker   . Smokeless tobacco: Not on file  . Alcohol Use: Yes    No Known Allergies  Current Outpatient Prescriptions  Medication Sig Dispense Refill  . acetaminophen (TYLENOL) 325 MG tablet Take 650 mg by mouth 2 (two) times daily.      . aspirin EC 81 MG tablet Take 81 mg by mouth daily.      . Calcium Carbonate-Vitamin D (CALCIUM + D PO) Take 1 tablet by mouth daily. Unsure of strength      . fish oil-omega-3 fatty acids 1000 MG capsule Take 1 g by mouth 2 (two) times daily.      . Multiple  Vitamin (MULTIVITAMIN WITH MINERALS) TABS Take 1 tablet by mouth daily.      . pantoprazole (PROTONIX) 40 MG tablet Take 40 mg by mouth 2 (two) times daily.      . polyethylene glycol powder (GLYCOLAX/MIRALAX) powder Take 17 g by mouth daily as needed.        No current facility-administered medications for this visit.    Review of Systems Review of Systems  Constitutional: Negative.   HENT: Negative.   Eyes: Negative.   Respiratory: Negative.   Cardiovascular: Negative.   Gastrointestinal: Negative.   Endocrine: Negative.   Genitourinary: Negative.   Musculoskeletal: Negative.   Skin: Negative.   Allergic/Immunologic: Negative.   Neurological: Negative.   Hematological: Negative.   Psychiatric/Behavioral: Negative.     There were no vitals taken for this visit.  Physical Exam Physical Exam  Constitutional: She is oriented to person, place, and time. She appears well-developed and well-nourished.  HENT:  Head: Normocephalic and atraumatic.  Eyes: Conjunctivae and EOM are normal. Pupils are equal, round, and reactive to light.  Neck: Normal range of motion. Neck supple.  Cardiovascular: Normal rate, regular rhythm and normal heart sounds.   Pulmonary/Chest: Effort normal and breath sounds   normal.  There is a palpable hematoma in the upper portion of the left breast. Otherwise there is no other palpable mass in either breast. There is no palpable axillary, supraclavicular, or cervical lymphadenopathy.  Abdominal: Soft. Bowel sounds are normal. She exhibits no mass. There is no tenderness.  Musculoskeletal: Normal range of motion.  Lymphadenopathy:    She has no cervical adenopathy.  Neurological: She is alert and oriented to person, place, and time.  Skin: Skin is warm and dry.  Psychiatric: She has a normal mood and affect. Her behavior is normal.    Data Reviewed As above  Assessment    The patient appears to have a 3-1/2 cm area of invasive ductal cancer in the  upper left breast. Because of the size of the cancer compared to her small breast size I think she would probably best treated with a mastectomy. I've talked to her in detail about the different options for treatment. At this point she also favors mastectomy. She'll be a good candidate for sentinel node mapping. I discussed with her in detail the risks and benefits of the surgery as well as some of the technical aspects and she understands and wishes to proceed. She is not interested in reconstruction.     Plan    Plan for left mastectomy and sentinel node mapping        TOTH III,Florentino Laabs S 12/29/2012, 12:15 PM    

## 2013-01-10 NOTE — Preoperative (Signed)
Beta Blockers   Reason not to administer Beta Blockers:Not Applicable 

## 2013-01-11 NOTE — Progress Notes (Signed)
1 Day Post-Op  Subjective: No complaints. Disappointed about her positive node  Objective: Vital signs in last 24 hours: Temp:  [97 F (36.1 C)-98.5 F (36.9 C)] 98.5 F (36.9 C) (11/18 0523) Pulse Rate:  [63-75] 74 (11/18 0523) Resp:  [8-19] 17 (11/18 0523) BP: (110-132)/(48-78) 113/48 mmHg (11/18 0523) SpO2:  [97 %-100 %] 99 % (11/18 0523) Weight:  [134 lb (60.782 kg)] 134 lb (60.782 kg) (11/17 2051) Last BM Date: 01/09/13  Intake/Output from previous day: 11/17 0701 - 11/18 0700 In: 3167.5 [I.V.:2667.5] Out: 1260 [Urine:1150; Drains:60; Blood:50] Intake/Output this shift:    Resp: clear to auscultation bilaterally Chest wall: skin flaps look healthy. drain output serosanguinous Cardio: regular rate and rhythm  Lab Results:  No results found for this basename: WBC, HGB, HCT, PLT,  in the last 72 hours BMET No results found for this basename: NA, K, CL, CO2, GLUCOSE, BUN, CREATININE, CALCIUM,  in the last 72 hours PT/INR No results found for this basename: LABPROT, INR,  in the last 72 hours ABG No results found for this basename: PHART, PCO2, PO2, HCO3,  in the last 72 hours  Studies/Results: Nm Sentinel Node Inj-no Rpt (breast)  01/10/2013   CLINICAL DATA: left breast cancer   Sulfur colloid was injected intradermally by the nuclear medicine  technologist for breast cancer sentinel node localization.     Anti-infectives: Anti-infectives   Start     Dose/Rate Route Frequency Ordered Stop   01/10/13 0600  ceFAZolin (ANCEF) IVPB 2 g/50 mL premix     2 g 100 mL/hr over 30 Minutes Intravenous On call to O.R. 01/09/13 1319 01/10/13 1105      Assessment/Plan: s/p Procedure(s): MASTECTOMY WITH SENTINEL LYMPH NODE BIOPSY (Left) Discharge  LOS: 1 day    TOTH III,PAUL S 01/11/2013

## 2013-01-11 NOTE — Discharge Summary (Signed)
Physician Discharge Summary  Patient ID: Whitney Burns  MRN: [id number] DOB/AGE: 03-17-1940 72 y.o.  Admit date: 01/10/2013 Discharge date: 01/11/2013  Admission Diagnoses:  Discharge Diagnoses:  Active Problems:   * No active hospital problems. *   Discharged Condition: good  Hospital Course: the pt underwent left modified radical mastectomy. She tolerated the surgery well. She stayed overnight and is now ready for discharge home  Consults: None  Significant Diagnostic Studies: none  Treatments: surgery: as above  Discharge Exam: Blood pressure 113/48, pulse 74, temperature 98.5 F (36.9 C), temperature source Oral, resp. rate 17, height 5\' 2"  (1.575 m), weight 134 lb (60.782 kg), SpO2 99.00%. Chest wall: skin flaps look good  Disposition: 01-Home or Self Care  Discharge Orders   Future Appointments Provider Department Dept Phone   02/09/2013 3:30 PM Lowella Dell, MD [facility] [phone]   Future Orders Complete By Expires   Call MD for:  difficulty breathing, headache or visual disturbances  As directed    Call MD for:  extreme fatigue  As directed    Call MD for:  hives  As directed    Call MD for:  persistant dizziness or light-headedness  As directed    Call MD for:  persistant nausea and vomiting  As directed    Call MD for:  redness, tenderness, or signs of infection (pain, swelling, redness, odor or green/yellow discharge around incision site)  As directed    Call MD for:  severe uncontrolled pain  As directed    Call MD for:  temperature >100.4  As directed    Diet - low sodium heart healthy  As directed    Discharge instructions  As directed    Comments:     Sponge bathe while drain is in. No overhead activity. Binder if it is comfortable. May remove if desired. Use colace and miralax to avoid constipation   Increase activity slowly  As directed    No wound care  As directed        Medication List         acetaminophen 500 MG tablet  Commonly known as:  TYLENOL  Take 1,000 mg by mouth 2 (two) times daily.     aspirin EC 81 MG tablet  Take 81 mg by mouth at bedtime.     CALCIUM + D PO  Take 1 tablet by mouth 2 (two) times daily. Unsure of strength     fish oil-omega-3 fatty acids 1000 MG capsule  Take 1 g by mouth 2 (two) times daily.     multivitamin with minerals Tabs tablet  Take 1 tablet by mouth every morning.     pantoprazole 40 MG tablet  Commonly known as:  PROTONIX  Take 40 mg by mouth 2 (two) times daily.     polyethylene glycol powder powder  Commonly known as:  GLYCOLAX/MIRALAX  Take 17 g by mouth daily as needed.     STOOL SOFTENER PO  Take 1 capsule by mouth daily as needed.           Follow-up Information   Follow up with Robyne Askew, MD In 1 week.   Specialty:  General Surgery   Contact information:   [address] [place] [address] [phone]       Signed: Robyne Askew 01/11/2013, 8:49 AM

## 2013-01-11 NOTE — Progress Notes (Signed)
Pt d/c 1010, via wheelchair with family, d/c instructions given with teach back method, pt & family able to recite instructions.  Materials given to do drain care, pt pain free, without complaints.

## 2013-01-12 ENCOUNTER — Other Ambulatory Visit: Payer: Self-pay | Admitting: Oncology

## 2013-01-12 ENCOUNTER — Encounter (HOSPITAL_COMMUNITY): Payer: Self-pay | Admitting: General Surgery

## 2013-01-13 ENCOUNTER — Telehealth (INDEPENDENT_AMBULATORY_CARE_PROVIDER_SITE_OTHER): Payer: Self-pay

## 2013-01-13 ENCOUNTER — Encounter: Payer: Self-pay | Admitting: *Deleted

## 2013-01-13 NOTE — Telephone Encounter (Signed)
Patient calling in for pathology results.  Patient advised that at this time her results are not available.  Patient advised that Dr. Carolynne Edouard or Marcelino Duster will notify patient when results are available.  Patient verbalized understanding.

## 2013-01-13 NOTE — Progress Notes (Signed)
Oncotype order received by Dr. Darnelle Catalan.  Requisition sent to pathology.   Received by Beverely Low.

## 2013-01-17 ENCOUNTER — Telehealth: Payer: Self-pay | Admitting: *Deleted

## 2013-01-17 NOTE — Telephone Encounter (Signed)
Spoke to pt 1 wk post op.  Pt relate she is doing well.  Confirmed future appts.  Pt denies further needs at this time.  Encourage pt to call with questions or concerns.  Received verbal understanding.  Contact information given.

## 2013-01-18 NOTE — Telephone Encounter (Signed)
Dr Toth called pt.  

## 2013-01-19 ENCOUNTER — Encounter (INDEPENDENT_AMBULATORY_CARE_PROVIDER_SITE_OTHER): Payer: Self-pay | Admitting: General Surgery

## 2013-01-19 ENCOUNTER — Ambulatory Visit (INDEPENDENT_AMBULATORY_CARE_PROVIDER_SITE_OTHER): Payer: Medicare Other | Admitting: General Surgery

## 2013-01-19 VITALS — BP 98/56 | HR 64 | Temp 98.9°F | Resp 14 | Ht 62.0 in | Wt 133.6 lb

## 2013-01-19 DIAGNOSIS — C50412 Malignant neoplasm of upper-outer quadrant of left female breast: Secondary | ICD-10-CM

## 2013-01-19 DIAGNOSIS — C50419 Malignant neoplasm of upper-outer quadrant of unspecified female breast: Secondary | ICD-10-CM

## 2013-01-19 NOTE — Patient Instructions (Signed)
Continue to record output

## 2013-01-23 ENCOUNTER — Telehealth (INDEPENDENT_AMBULATORY_CARE_PROVIDER_SITE_OTHER): Payer: Self-pay | Admitting: Surgery

## 2013-01-23 DIAGNOSIS — C50412 Malignant neoplasm of upper-outer quadrant of left female breast: Secondary | ICD-10-CM

## 2013-01-23 NOTE — Telephone Encounter (Signed)
Patient called to report a fall on 11/29.  No problems with surgical wounds.  Complains of pain and limite ROM in left elbow.  Slight STS.  Plans to see Dr. Carolynne Edouard on 12/2 in [facility] office.  Advised rest, elevation, NSAID use.  Velora Heckler, MD, [facility] Office: [phone]

## 2013-01-25 ENCOUNTER — Encounter (HOSPITAL_COMMUNITY): Payer: Self-pay

## 2013-01-25 ENCOUNTER — Ambulatory Visit (INDEPENDENT_AMBULATORY_CARE_PROVIDER_SITE_OTHER): Payer: Medicare Other | Admitting: General Surgery

## 2013-01-25 ENCOUNTER — Ambulatory Visit
Admission: RE | Admit: 2013-01-25 | Discharge: 2013-01-25 | Disposition: A | Discharge status: Home / Self Care | Payer: Medicare Other | Source: Ambulatory Visit | Attending: General Surgery | Admitting: General Surgery

## 2013-01-25 ENCOUNTER — Encounter (INDEPENDENT_AMBULATORY_CARE_PROVIDER_SITE_OTHER): Payer: Self-pay | Admitting: General Surgery

## 2013-01-25 VITALS — BP 136/82 | HR 66 | Temp 98.0°F | Resp 16 | Ht 62.0 in | Wt 132.8 lb

## 2013-01-25 DIAGNOSIS — M25522 Pain in left elbow: Secondary | ICD-10-CM | POA: Insufficient documentation

## 2013-01-25 DIAGNOSIS — M25529 Pain in unspecified elbow: Secondary | ICD-10-CM

## 2013-01-25 NOTE — Patient Instructions (Signed)
Get left elbow xrays today

## 2013-01-27 ENCOUNTER — Encounter: Payer: Self-pay | Admitting: *Deleted

## 2013-01-27 NOTE — Progress Notes (Signed)
Received Oncotype Dx results of 11.  Emailed MD w/ results.  Placed a copy to results in MD's box. Took copy to results to Med Rec to scan.

## 2013-02-01 ENCOUNTER — Encounter (INDEPENDENT_AMBULATORY_CARE_PROVIDER_SITE_OTHER): Payer: Self-pay | Admitting: General Surgery

## 2013-02-01 ENCOUNTER — Ambulatory Visit (INDEPENDENT_AMBULATORY_CARE_PROVIDER_SITE_OTHER): Payer: Medicare Other | Admitting: General Surgery

## 2013-02-01 VITALS — BP 110/62 | HR 72 | Temp 98.1°F | Resp 14 | Ht 62.0 in | Wt 132.0 lb

## 2013-02-01 DIAGNOSIS — C50419 Malignant neoplasm of upper-outer quadrant of unspecified female breast: Secondary | ICD-10-CM

## 2013-02-01 DIAGNOSIS — C50412 Malignant neoplasm of upper-outer quadrant of left female breast: Secondary | ICD-10-CM

## 2013-02-01 NOTE — Progress Notes (Signed)
Subjective:     Patient ID: Whitney Burns, female   DOB: 1940-12-06, 72 y.o.   MRN: [id number]  HPI The patient is a 72 year old white female who is 3 weeks status post left modified radical mastectomy. She was ER and PR positive and HER-2/neu negative. Her Oncotype score was 11. She is starting to complain of some pain along her left chest wall and axillary area. She is working on her physical therapy exercises.  Review of Systems     Objective:   Physical Exam On exam her left mastectomy incision is healing nicely with no sign of infection. There is no evidence of residual seroma. Her skin flaps are healthy.    Assessment:     The patient is 3 weeks status post left modified radical mastectomy     Plan:     At this point she will continue to work with physical therapy. She will meet with medical oncology on Thursday to decide about chemotherapy. I will plan to see her back in one month if she has not moved to [place] by then.

## 2013-02-01 NOTE — Patient Instructions (Signed)
Continue to work with PT

## 2013-02-03 ENCOUNTER — Ambulatory Visit (HOSPITAL_BASED_OUTPATIENT_CLINIC_OR_DEPARTMENT_OTHER): Payer: Medicare Other | Admitting: Oncology

## 2013-02-03 ENCOUNTER — Telehealth: Payer: Self-pay | Admitting: Oncology

## 2013-02-03 ENCOUNTER — Encounter: Payer: Self-pay | Admitting: *Deleted

## 2013-02-03 VITALS — BP 151/73 | HR 67 | Temp 98.2°F | Resp 18 | Ht 62.0 in | Wt 133.3 lb

## 2013-02-03 DIAGNOSIS — M8580 Other specified disorders of bone density and structure, unspecified site: Secondary | ICD-10-CM

## 2013-02-03 DIAGNOSIS — C50419 Malignant neoplasm of upper-outer quadrant of unspecified female breast: Secondary | ICD-10-CM

## 2013-02-03 DIAGNOSIS — Z170 Estrogen receptor positive status [ER+]: Secondary | ICD-10-CM

## 2013-02-03 DIAGNOSIS — C50412 Malignant neoplasm of upper-outer quadrant of left female breast: Secondary | ICD-10-CM

## 2013-02-03 NOTE — Telephone Encounter (Signed)
, °

## 2013-02-03 NOTE — Progress Notes (Signed)
ID: Whitney Burns OB: 01/01/1941  MR#: [id number]  HQI#:[id number]  PCP: Colette Ribas, MD GYN:   SUFelicity Pellegrini OTHER MD: Chipper Herb, Rajpreet Dogra  CHIEF COMPLAINT: "I have breast cancer"  HISTORY OF PRESENT ILLNESS: Whitney Burns had routine screening mammography at [facility] 11/26/2012, showing a possible mass in the left breast. Additional views 12/17/2012 confirmed an irregular density in the upper outer quadrant of the left breast, which was barely palpable as a thickening. Ultrasound confirmed an irregular hypoechoic mass in this area, measuring 1.6 cm. A second, 0.7 cm mass was found in the same quadrant. In the left axilla there was a single lymph node with slightly thickened cortex.  Accordingly on 12/17/2012 the patient underwent biopsy of the 2 breast masses in question as well as the lymph node just described. This showed (SAA [id number]) both the breast masses to consist of identical invasive ductal breast cancers, grade 1, estrogen receptor 100% positive, progesterone receptor 99% positive, with an MIB-1 of 16%, and no HER-2 amplification. Biopsy of the left axillary lymph node was negative.  With this information the patient proceeded to bilateral breast MRI 12/27/2012 at [facility] imaging. This showed the 2 masses in question to be "connected" by an area of abnormality, so that taken together the suspicious area measured 3.0 cm. There were no abnormal appearing lymph nodes and no other areas of concern in either breast.  Her subsequent history is as detailed below  INTERVAL HISTORY: Whitney Burns returns today for followup of her breast cancer accompanied by her husband Whitney Burns. Since her last visit here she had her definitive surgery, which consisted of the left simple mastectomy and sentinel lymph node sampling. The pathology from that procedure ([id number] [id number]) SHOWED A 3.2 CM INVASIVE DUCTAL CARCINOMA, GRADE 1, AND 2/18 LYMPH NODES INVOLVED BY TUMOR(1 with a micrometastatic  deposit, the other one with a micrometastatic deposit). Margins were ample. She also had an Oncotype DX, with a score of 11. I asked her to meet with our research nurse to discuss our as 1007 study, which is perfect for her, in my opinion, since on the one hand the standard recommendation is for her to receive chemotherapy, and on the other the Oncotype strongly predicts no benefits from chemotherapy. She is here today to discuss her options.   REVIEW OF SYSTEMS: She tolerated the surgery well, with no major complications and specifically no significant pain, bleeding, fever, or swelling. She does have some soreness in that area, which he treats with Tylenol. She has a little bit more insomnia than before. A detailed review of systems today was otherwise entirely negative.  PAST MEDICAL HISTORY: Past Medical History  Diagnosis Date   Hyperlipidemia    Arthritis    Breast cancer    GERD (gastroesophageal reflux disease)     Pt takes Protonix.   Joint pain     in hands only   Joint swelling    Cataracts, bilateral     PAST SURGICAL HISTORY: Past Surgical History  Procedure Laterality Date   Tonsillectomy and adenoidectomy     Cholecystectomy     Tubal ligation     Colonoscopy  09/03/2011    Procedure: COLONOSCOPY;  Surgeon: Malissa Hippo, MD;  Location: [facility];  Service: Endoscopy;  Laterality: N/A;  730   Knee surgery      Right knee   Laminectomy and microdiscectomy spine     Eye surgery      cataract surgery both eyes  Mastectomy w/ sentinel node biopsy Left 01/10/2013    Procedure: MASTECTOMY WITH SENTINEL LYMPH NODE BIOPSY;  Surgeon: Robyne Askew, MD;  Location: [facility] OR;  Service: General;  Laterality: Left;   Breast surgery Left 01/10/2013    FAMILY HISTORY Family History  Problem Relation Age of Onset   Pneumonia Mother    Emphysema Father    the patient's father died at the age of 51 from complications of emphysema. The patient's mother died  at the age of 54 from pneumonia. The patient was an only child. There is no history of breast or ovarian cancer in the family.  GYNECOLOGIC HISTORY:  Menarche age 15, first live birth age 64, she is GX P2. She went through the change of life approximately 20 years ago. She took hormone replacement for approximately 10 years.  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 Her husband Whitney Burns") is retired from working for a Patent attorney. Son Whitney Burns lives in [place] still [place] and works in Airline pilot. Daughter Whitney Burns lives in [place] and works there as a Programmer, systems. The patient has 3 grandchildren. She is a International aid/development worker.   ADVANCED DIRECTIVES: In place   HEALTH MAINTENANCE: History  Substance Use Topics   Smoking status: Former Smoker    Quit date: 01/19/1998   Smokeless tobacco: Never Used     Comment: pt reports quitting 15 years ago   Alcohol Use: Yes     Comment: drinks all occ     Colonoscopy: 2013  PAP: 2013  Bone density: Other [facility] July 2013 showed minimal osteopenia, with a T score of -1.1  Lipid panel:  No Known Allergies  Current Outpatient Prescriptions  Medication Sig Dispense Refill   acetaminophen (TYLENOL) 500 MG tablet Take 1,000 mg by mouth 2 (two) times daily.       Ascorbic Acid (VITAMIN C ER PO) Take 500 mg by mouth.        aspirin EC 81 MG tablet Take 81 mg by mouth at bedtime.        Calcium Carbonate-Vitamin D (CALCIUM + D PO) Take 1 tablet by mouth 2 (two) times daily. Unsure of strength       Docusate Calcium (STOOL SOFTENER PO) Take 1 capsule by mouth daily as needed.       fish oil-omega-3 fatty acids 1000 MG capsule Take 1 g by mouth 2 (two) times daily.       Multiple Vitamin (MULTIVITAMIN WITH MINERALS) TABS Take 1 tablet by mouth every morning.        pantoprazole (PROTONIX) 40 MG tablet Take 40 mg by mouth 2 (two) times daily.        polyethylene glycol powder (GLYCOLAX/MIRALAX) powder Take 17 g by mouth daily as needed.        Probiotic Product (PROBIOTIC FORMULA PO) Take by mouth as needed.       No current facility-administered medications for this visit.    OBJECTIVE: Middle-aged white woman who appears stated age 72 Vitals:   02/03/13 0807  BP: 151/73  Pulse: 67  Temp: 98.2 F (36.8 C)  Resp: 18     Body mass index is 24.37 kg/(m^2).    ECOG FS:1 - Symptomatic but completely ambulatory  Ocular: Sclerae unicteric, pupils equal and round Ear-nose-throat: Oropharynx clear and moist Lymphatic: No cervical or supraclavicular adenopathy Lungs no rales or rhonchi, good excursion bilaterally Heart regular rate and  rhythm, no murmur appreciated Abd soft, nontender, positive bowel sounds MSK no focal spinal tenderness, no joint edema Neuro: non-focal, well-oriented, appropriate affect Breasts: The right breast is status post mastectomy. The incision is healing nicely. There is no dehiscence, erythema, swelling, or unusual tenderness.. The right axilla is benign. The left breast is unremarkable.   LAB RESULTS:  CMP     Component Value Date/Time   NA 139 12/29/2012 0821   K 4.3 12/29/2012 0821   CO2 26 12/29/2012 0821   GLUCOSE 93 12/29/2012 0821   BUN 12.7 12/29/2012 0821   CREATININE 0.7 12/29/2012 0821   CALCIUM 9.8 12/29/2012 0821   PROT 6.8 12/29/2012 0821   ALBUMIN 3.4* 12/29/2012 0821   AST 20 12/29/2012 0821   ALT 15 12/29/2012 0821   ALKPHOS 52 12/29/2012 0821   BILITOT 0.38 12/29/2012 0821    I No results found for this basename: SPEP,  UPEP,   kappa and lambda light chains    Lab Results  Component Value Date   WBC 5.1 12/29/2012   NEUTROABS 2.9 12/29/2012   HGB 13.2 12/29/2012   HCT 38.7 12/29/2012   MCV 92.3 12/29/2012   PLT 313 12/29/2012      Chemistry      Component Value Date/Time   NA 139 12/29/2012 0821   K 4.3 12/29/2012 0821   CO2 26 12/29/2012 0821   BUN 12.7 12/29/2012 0821    CREATININE 0.7 12/29/2012 0821      Component Value Date/Time   CALCIUM 9.8 12/29/2012 0821   ALKPHOS 52 12/29/2012 0821   AST 20 12/29/2012 0821   ALT 15 12/29/2012 0821   BILITOT 0.38 12/29/2012 0821       No results found for this basename: LABCA2    No components found with this basename: LABCA125    No results found for this basename: INR,  in the last 168 hours  Urinalysis No results found for this basename: colorurine,  appearanceur,  labspec,  phurine,  glucoseu,  hgbur,  bilirubinur,  ketonesur,  proteinur,  urobilinogen,  nitrite,  leukocytesur    STUDIES: US Breast Left  12/17/2012   CLINICAL DATA:  The patient returns after screening study for evaluation of the left breast.  EXAM: DIGITAL DIAGNOSTIC  left MAMMOGRAM  ULTRASOUND left BREAST  COMPARISON:  11/26/2012 and earlier  ACR Breast Density Category b: There are scattered areas of fibroglandular density.  FINDINGS: Spot compression views confirm presence of an irregular masslike density in the upper-outer quadrant of the left breast. There are associated microcalcifications, primarily rounded in morphology.  On physical exam I palpate soft thickening in the upper-outer quadrant of the left breast. I palpate no discrete mass however.  Ultrasound is performed, showing an irregular hypoechoic mass in the 1 o'clock location of the left breast, 4 cm from the nipple. This is associated with internal calcifications and acoustic shadowing. Mass measures 1.6 x 0.9 x 1.1 cm. In the 1 o'clock location, 3 cm from the nipple, a smaller mass has a similar appearance and measures 0.7 x 0.6 x 0.6 cm. Both lesion show increased blood flow on Doppler evaluation. The entire abnormality, inclusive of both lesions is 3.5 cm.  Evaluation of the left axilla shows a single lymph node with slightly thickened cortex, warranting biopsy.  IMPRESSION: 1. Suspicious mass in the 1 o'clock location of the left breast, 4 cm from the nipple. 2. Suspicious mass in  the 1 o'clock location of the left breast, 3 cm from  the nipple. 3. Suspicious left axillary lymph node.  RECOMMENDATION: Ultrasound-guided core biopsy of the 3 lesions is recommended. The patient request biopsy on the same day. This is performed and dictated separately.  I have discussed the findings and recommendations with the patient. Results were also provided in writing at the conclusion of the visit. If applicable, a reminder letter will be sent to the patient regarding the next appointment.  BI-RADS CATEGORY  4: Suspicious abnormality - biopsy should be considered.   Electronically Signed   By: Rosalie Gums M.D.   On: 12/17/2012 11:00   Mr Breast Bilateral W Wo Contrast  12/27/2012   CLINICAL DATA:  Recently diagnosed left breast cancer following ultrasound-guided biopsies of 2 mass cysts, including that the mass at 1 o'clock position 4 cm from the nipple and a mass at 1 o'clock position 3 cm from the nipple. On ultrasound, the two areas the biopsied spanned approximately 3.5 cm.  EXAM: MR BILATERAL BREAST WITHOUT AND WITH CONTRAST  LABS:  BUN and creatinine were obtained on site at [facility] at  [address]  Results:  BUN 11 mg/dL,  Creatinine 0.7 the mg/dL.  TECHNIQUE: Multiplanar, multisequence MR images of both breasts were obtained prior to and following the intravenous administration of ml of MultiHance.  THREE-DIMENSIONAL MR IMAGE RENDERING ON INDEPENDENT WORKSTATION:  Three-dimensional MR images were rendered by post-processing of the original MR data on an independent workstation. The three-dimensional MR images were interpreted, and findings are reported in the following complete MRI report for this study.  COMPARISON:  Previous exams  FINDINGS: Breast composition: c:  Heterogeneous fibroglandular tissue  Background parenchymal enhancement: Mild  Right breast: No mass or abnormal enhancement.  Left breast: In the upper-outer quadrant of the left breast, middle 3rd, is an  irregular enhancing mass with associated biopsy clip artifact that measures approximate 1.3 x 0.7 x 0.7 cm. Anterior to the larger biopsied mass are several subcentimeter nodular areas of enhancement arranged in a segmental distribution. On MRI suspicious enhancement spans 3.0 cm anterior to posterior diameter by approximately 1.7 cm maximum transverse diameter by 1.5 cm craniocaudal span. The more anteriorly positioned biopsy clip, in the subareolar left breast is along the inferior aspect of suspicious enhancement. Post biopsy hematoma with some associated peripheral enhancement is noted in the subareolar left breast superior to the level of the nipple.  No unsuspected areas of abnormal enhancement are seen in the left breast.  Lymph nodes: No abnormal appearing lymph nodes.  Ancillary findings:  None.  IMPRESSION: 1. Segmental area of enhancement in the upper-outer quadrant of the left breast spans approximately 3.0 cm maximum AP diameter and includes both regions recently biopsied under ultrasound guidance.  2. No evidence of malignancy in the right breast.  RECOMMENDATION: Treatment planning for known left breast cancer.  BI-RADS CATEGORY  6: Known biopsy-proven malignancy - appropriate action should be taken.   Electronically Signed   By: Britta Mccreedy M.D.   On: 12/27/2012 16:39   Mm Digital Diagnostic Unilat L  12/17/2012   CLINICAL DATA:  Status post ultrasound-guided core biopsy of 2 lesions within the left breast, 1 o'clock location.  EXAM: DIGITAL DIAGNOSTIC UNILATERAL LEFT MAMMOGRAM  COMPARISON:  12/17/2012 and earlier  FINDINGS: Mammographic images were obtained following ultrasound guided biopsy of mass in the 1 o'clock location of the left breast, 4 cm from the nipple and 1 o'clock location of the left breast, 3 cm from the nipple. A top hat shaped  clip is identified 4 cm from the nipple. A dumbbell-shaped clip is identified 3 cm from the nipple. The 2 clips are approximately 3.1 cm apart. Of  note, axillary clip was not placed.  IMPRESSION: Tissue marker clips are in expected location after biopsies.  Final Assessment: Post Procedure Mammograms for Marker Placement   Electronically Signed   By: Rosalie Gums M.D.   On: 12/17/2012 11:17   Mm Digital Diagnostic Unilat L  12/17/2012   CLINICAL DATA:  The patient returns after screening study for evaluation of the left breast.  EXAM: DIGITAL DIAGNOSTIC  left MAMMOGRAM  ULTRASOUND left BREAST  COMPARISON:  11/26/2012 and earlier  ACR Breast Density Category b: There are scattered areas of fibroglandular density.  FINDINGS: Spot compression views confirm presence of an irregular masslike density in the upper-outer quadrant of the left breast. There are associated microcalcifications, primarily rounded in morphology.  On physical exam I palpate soft thickening in the upper-outer quadrant of the left breast. I palpate no discrete mass however.  Ultrasound is performed, showing an irregular hypoechoic mass in the 1 o'clock location of the left breast, 4 cm from the nipple. This is associated with internal calcifications and acoustic shadowing. Mass measures 1.6 x 0.9 x 1.1 cm. In the 1 o'clock location, 3 cm from the nipple, a smaller mass has a similar appearance and measures 0.7 x 0.6 x 0.6 cm. Both lesion show increased blood flow on Doppler evaluation. The entire abnormality, inclusive of both lesions is 3.5 cm.  Evaluation of the left axilla shows a single lymph node with slightly thickened cortex, warranting biopsy.  IMPRESSION: 1. Suspicious mass in the 1 o'clock location of the left breast, 4 cm from the nipple. 2. Suspicious mass in the 1 o'clock location of the left breast, 3 cm from the nipple. 3. Suspicious left axillary lymph node.  RECOMMENDATION: Ultrasound-guided core biopsy of the 3 lesions is recommended. The patient request biopsy on the same day. This is performed and dictated separately.  I have discussed the findings and recommendations  with the patient. Results were also provided in writing at the conclusion of the visit. If applicable, a reminder letter will be sent to the patient regarding the next appointment.  BI-RADS CATEGORY  4: Suspicious abnormality - biopsy should be considered.   Electronically Signed   By: Rosalie Gums M.D.   On: 12/17/2012 11:00   Mm Radiologist Eval And Mgmt  12/22/2012   *RADIOLOGY REPORT*  ESTABLISHED PATIENT OFFICE VISIT - LEVEL II [phone])  Chief Complaint:  The patient returns for discussion of pathologic findings.  History:  The patient underwent ultrasound-guided core needle biopsy of a mass at 1 o'clock 4 cm from the left nipple and a mass at 1 o'clock 3 cm from the left nipple as well as an abnormal left axillary lymph node.  Exam:  On physical examination, there is a small amount of ecchymosis at the biopsy site.  Each incision site is clean and dry with no sign of hematoma or infection.  Pathology: Each mass in the left breast at 1 o'clock is consistent with invasive ductal carcinoma and ductal carcinoma in situ.  There is no evidence of metastatic involvement of the biopsied lymph node.  Assessment and Plan:  Results were discussed with the patient and her husband.  She reports no complications from the procedures. The patient was scheduled to be evaluated in the [facility] on 12/29/2012.  Breast MRI has been scheduled for  12/27/2012 at 9:15 a.m.  Questions were answered.  Informational materials were given.   Original Report Authenticated By: Cain Saupe, M.D.   [place] Lt Breast Bx W Loc Dev 1st Lesion Img Bx Spec US Guide  12/17/2012   CLINICAL DATA:  The patient has 2 suspicious masses in the 1 o'clock location of the left breast and a suspicious left axillary lymph node.  EXAM: DIAGNOSTIC ULTRASOUND CORE VACUUM ; RADIOLOGY EXAMINATION  COMPARISON:  12/17/2012 and earlier  PROCEDURE: I met with the patient and we discussed the procedure of ultrasound-guided  biopsy, including benefits and alternatives. We discussed the high likelihood of a successful procedure. We discussed the risks of the procedure including infection, bleeding, tissue injury, clip migration, and inadequate sampling. Informed written consent was given.  Using sterile technique and 2% Lidocaine as local anesthetic, under direct ultrasound visualization, a 12 gauge vacuum-assisteddevice was used to perform biopsy of nodule in the 1 o'clock location of the left breast, 4 cm from the nipple using a lateral approach. At the conclusion of the procedure, a top hat shaped tissue marker clip was deployed into the biopsy cavity. Follow-up 2-view mammogram was performed and dictated separately.  Using the same technique, a 12 gauge vacuum assisted device was used to perform biopsy of the nodule in the 1 o'clock location of the left breast, 3 cm from the nipple, also using a lateral approach. Following the procedure, a dumbbell-shaped clip was placed at the 2nd biopsy site.  Using sterile technique, 2% lidocaine as local anesthetic, and under direct ultrasound visualization, a 14 gauge spring-loaded device was used to perform biopsy of the left axillary lymph node.  Followup two-view mammogram is performed and is dictated separately.  The usual time-out protocol was performed immediately prior to the procedure.  IMPRESSION: Ultrasound-guided biopsy of 2 lesions in the 1 o'clock location of the left breast and an enlarged left axillary lymph node. No apparent complications.   Electronically Signed   By: Rosalie Gums M.D.   On: 12/17/2012 11:15   [place] Lt Breast Bx W Loc Dev Ea Add Lesion Img Bx Spec US Guide  12/17/2012   CLINICAL DATA:  The patient has 2 suspicious masses in the 1 o'clock location of the left breast and a suspicious left axillary lymph node.  EXAM: DIAGNOSTIC ULTRASOUND CORE VACUUM ; RADIOLOGY EXAMINATION  COMPARISON:  12/17/2012 and earlier  PROCEDURE: I met with the patient and we discussed the  procedure of ultrasound-guided biopsy, including benefits and alternatives. We discussed the high likelihood of a successful procedure. We discussed the risks of the procedure including infection, bleeding, tissue injury, clip migration, and inadequate sampling. Informed written consent was given.  Using sterile technique and 2% Lidocaine as local anesthetic, under direct ultrasound visualization, a 12 gauge vacuum-assisteddevice was used to perform biopsy of nodule in the 1 o'clock location of the left breast, 4 cm from the nipple using a lateral approach. At the conclusion of the procedure, a top hat shaped tissue marker clip was deployed into the biopsy cavity. Follow-up 2-view mammogram was performed and dictated separately.  Using the same technique, a 12 gauge vacuum assisted device was used to perform biopsy of the nodule in the 1 o'clock location of the left breast, 3 cm from the nipple, also using a lateral approach. Following the procedure, a dumbbell-shaped clip was placed at the 2nd biopsy site.  Using sterile technique, 2% lidocaine as local anesthetic, and under direct ultrasound visualization, a 14 gauge spring-loaded device  was used to perform biopsy of the left axillary lymph node.  Followup two-view mammogram is performed and is dictated separately.  The usual time-out protocol was performed immediately prior to the procedure.  IMPRESSION: Ultrasound-guided biopsy of 2 lesions in the 1 o'clock location of the left breast and an enlarged left axillary lymph node. No apparent complications.   Electronically Signed   By: Rosalie Gums M.D.   On: 12/17/2012 11:15   [place] Lt Breast Bx W Loc Dev Ea Add Lesion Img Bx Spec US Guide  12/17/2012   CLINICAL DATA:  The patient has 2 suspicious masses in the 1 o'clock location of the left breast and a suspicious left axillary lymph node.  EXAM: DIAGNOSTIC ULTRASOUND CORE VACUUM ; RADIOLOGY EXAMINATION  COMPARISON:  12/17/2012 and earlier  PROCEDURE: I met with  the patient and we discussed the procedure of ultrasound-guided biopsy, including benefits and alternatives. We discussed the high likelihood of a successful procedure. We discussed the risks of the procedure including infection, bleeding, tissue injury, clip migration, and inadequate sampling. Informed written consent was given.  Using sterile technique and 2% Lidocaine as local anesthetic, under direct ultrasound visualization, a 12 gauge vacuum-assisteddevice was used to perform biopsy of nodule in the 1 o'clock location of the left breast, 4 cm from the nipple using a lateral approach. At the conclusion of the procedure, a top hat shaped tissue marker clip was deployed into the biopsy cavity. Follow-up 2-view mammogram was performed and dictated separately.  Using the same technique, a 12 gauge vacuum assisted device was used to perform biopsy of the nodule in the 1 o'clock location of the left breast, 3 cm from the nipple, also using a lateral approach. Following the procedure, a dumbbell-shaped clip was placed at the 2nd biopsy site.  Using sterile technique, 2% lidocaine as local anesthetic, and under direct ultrasound visualization, a 14 gauge spring-loaded device was used to perform biopsy of the left axillary lymph node.  Followup two-view mammogram is performed and is dictated separately.  The usual time-out protocol was performed immediately prior to the procedure.  IMPRESSION: Ultrasound-guided biopsy of 2 lesions in the 1 o'clock location of the left breast and an enlarged left axillary lymph node. No apparent complications.   Electronically Signed   By: Rosalie Gums M.D.   On: 12/17/2012 11:15    Patient: GREG, ECKRICH Collected: 01/10/2013 Client: [facility] Accession: [id number] Received: 01/10/2013 Chevis Pretty, MD DOB: Aug 22, 1940 Age: 81 Gender: F Reported: 01/18/2013 [address] Patient Ph: [phone] MRN #: [id number] [place], [place] [address] Visit #: [id number] Chart  #: Phone:  Fax: CC: Marland Kitchen, MD Marianne Sofia, RN REPORT OF SURGICAL PATHOLOGY REASON FOR ADDENDUM, AMENDMENT OR CORRECTION: [id number]: CORRECTION NOTE: In performing our routine Quality Assurance procedures, it was noted that an incorrect Medical Record Number was entered. To allow for electronic transmittal of results, the MRN number has been corrected. (to: 01/18/13) ATTENTION: Please note that the diagnosis has not changed from the previously issued report. Previous sign out date: 01/12/2013 ADDITIONAL INFORMATION: 6. A sample (block 6C) was sent to [facility] for Oncotype testing. The patient's recurrence score is 11. Those patients who had a recurrence score of 11 had an average rate of distant recurrence of 10%. (JBK:ecj 01/25/2013) Pecola Leisure MD Pathologist, Electronic Signature ( Signed 01/25/2013) 6. CHROMOGENIC IN-SITU HYBRIDIZATION Results: HER-2/NEU BY CISH - NO AMPLIFICATION OF HER-2 DETECTED. RESULT RATIO OF HER2: CEP 17 SIGNALS 1.43 AVERAGE  HER2 COPY NUMBER PER CELL 2.50 REFERENCE RANGE NEGATIVE HER2/Chr17 Ratio <2.0 and Average HER2 copy number <4.0 EQUIVOCAL HER2/Chr17 Ratio <2.0 and Average HER2 copy number 4.0 and <6.0 POSITIVE HER2/Chr17 Ratio >=2.0 and/or Average HER2 copy number >=6.0 Pecola Leisure MD Pathologist, Electronic Signature ( Signed 01/19/2013) 1 of 5 Amended copy Corrected FINAL for Castelluccio, Saprina P ([id number]) FINAL DIAGNOSIS Diagnosis 1. Lymph node, sentinel, biopsy, Left #1 - ONE LYMPH NODE, POSITIVE FOR METASTATIC MAMMARY CARCINOMA (1/1) - INTRANODAL TUMOR DEPOSIT IS 4 MM - NO EXTRACAPSULAR TUMOR EXTENSION IDENTIFIED. 2. Lymph node, sentinel, biopsy, Left #2 - ONE LYMPH NODE, POSITIVE FOR MICROMETASTATIC MAMMARY CARCINOMA (1/1). - INTRANODAL TUMOR DEPOSIT IS 1 MM - NO EXTRACAPSULAR TUMOR EXTENSION IDENTIFIED. 3. Lymph node, sentinel, biopsy, Left #3 - ONE LYMPH NODE, NEGATIVE FOR TUMOR (0/1). 4.  Lymph node, sentinel, biopsy, Left #4 - ONE LYMPH NODE, NEGATIVE FOR TUMOR (0/1). 5. Lymph node, sentinel, biopsy, Left #5 - ONE LYMPH NODE, NEGATIVE FOR TUMOR (0/1). 6. Breast, simple mastectomy, Left - INVASIVE DUCTAL CARCINOMA, SEE COMMENT. - LYMPHOVASCULAR INVASION IDENTIFIED. - INVASIVE TUMOR IS 2.5 CM FROM NEAREST MARGIN (DEEP). - DUCTAL CARCINOMA IN SITU - PREVIOUS BIOPSY SITE IDENTIFIED. - SEE TUMOR SYNOPTIC TEMPLATE BELOW. 7. Lymph nodes, regional resection, Left axilla - THIRTEEN LYMPH NODES, NEGATIVE FOR TUMOR (0/13). Microscopic Comment 6. BREAST, INVASIVE TUMOR, WITH LYMPH NODE SAMPLING Specimen, including laterality and lymph node sampling (sentinel, non-sentinel): Left breast with sentinel lymph node sampling. Procedure: Simple mastectomy. Histologic type: Ductal Grade: I of III Tubule formation: 2 Nuclear pleomorphism: 2 Mitotic:1 Tumor size (gross measurement): 3.2 cm Margins: Invasive, distance to closest margin: 2.5 cm, deep In-situ, distance to closest margin: 2.5 cm, deep If margin positive, focally or broadly: N/A Lymphovascular invasion: Present. Ductal carcinoma in situ: Present. Grade: I of III Extensive intraductal component: Absent. Lobular neoplasia: Absent. Tumor focality: Unifocal Treatment effect: None. If present, treatment effect in breast tissue, lymph nodes or both: N/A Extent of tumor: Skin: Negative for tumor. Nipple: Negative for tumor 2 of 5 Amended copy Corrected FINAL for Fazzini, Mandee P ([id number]) Microscopic Comment(continued) Skeletal muscle: Negative for tumor. Lymph nodes: Examined: 5 Sentinel 13 Non-sentinel 18 Total Lymph nodes with metastasis: 2 Isolated tumor cells (< 0.2 mm): 0 Micrometastasis: (> 0.2 mm and < 2.0 mm): x1 Macrometastasis: (> 2.0 mm): x1 Extracapsular extension: Absent. Breast prognostic profile: Estrogen receptor: Not repeated, previous study demonstrated 100% positivity  ([id number]) Progesterone receptor: Not repeated, previous study demonstrated 99% positivity ([id number]) Her 2 neu: Repeated, previous study demonstrated no amplification (2.35) ([id number]) Ki-67: Not repeated, previous study demonstrated 16% proliferation rate ([id number]) Non-neoplastic breast: Previous biopsy site, fibrocystic changes, usual ductal hyperplasia, and microcalcifications. TNM: pT2, pN1a, pMX. Comments: None. (CRR:gt, 01/12/13) [place] RUND DO Pathologist, Electronic Signature (Case signed 01/12/2013) Corrected Report Signer Pecola Leisure MD Pathologist, Electronic Signature (Case signed 01/18/2013)   ASSESSMENT: 72 y.o. [place] woman status post left breast and left axillary lymph node biopsy 12/17/2012 for a clinical T2 pN0, stage IIA invasive ductal carcinoma, grade 1, estrogen receptor 100% positive, progesterone receptor 99% positive, with no HER-2 amplification, and an MIB-1 of 16%  (1) status post left mastectomy and sentinel lymph node sampling 01/10/2013 for a pT2 pN1, stage IIB invasive ductal carcinoma, grade 1, with ample margins, repeat HER-2 testing again negative  (2) Oncotype DX shows a score of 11 predicting a risk of recurrence outside of the breast within 10 years of 10% if the patient's only systemic treatment is tamoxifen for  5 years  (3) declined participating in [facility] S1007; opted against chemotherapy; also decided against reconstruction  (4) anastrozole started December 2014  PLAN: We met for approximately one hour today to clarify Latresa's situation. She understands the standard recommendation for patients who have stage IIB breast cancer is chemotherapy (see for example NCCN guidelines, which however include a notice that the data for patient's older than 70 is more tentative). On the other hand her Oncotype predicts essentially no benefit from chemotherapy.  That is why my recommendation to her was that she participate in the S1007 study,  which randomly assigns women like her to chemotherapy versus no chemotherapy. However she is very uncomfortable with the idea of randomization. She wants to make the decision herself. After much discussion today the decision she made was that she does not want to expose herself to chemotherapy since there is at least a significant doubt that it would lower her risk of recurrence at all.  We then discussed antiestrogen options. She understands that 5 years of an aromatase inhibitor is better than 5 years on tamoxifen, so for example if she took anastrozole for 5 years, her 10 year risk of recurrence would be closer to 7% instead of 10%. We discussed the possible toxicities, side effects and complications of aromatase inhibitors. After all that, the decision was made to start on anastrozole and then reassess after 2 years whether she wants to switch to tamoxifen for the final 3 years or continue on anastrozole.  She is going to be in [place] over the holidays and she will establish herself with an oncologist there. Her plan is to sell her [place] home and move to [place] on a permanent basis. She is planning to be back in this area late February so we will schedule her for a bone density than, and a visit with me in early March. Today I also wrote her a prescription for a prosthesis.  Makari has a good understanding of this overall plan, and agrees with it. She knows the goal of her treatment is cure. She will call for any problems that may develop before the next visit.  Lowella Dell, MD   02/03/2013 9:03 AM

## 2013-02-04 ENCOUNTER — Other Ambulatory Visit: Payer: Self-pay | Admitting: *Deleted

## 2013-02-04 DIAGNOSIS — C50912 Malignant neoplasm of unspecified site of left female breast: Secondary | ICD-10-CM

## 2013-02-04 MED ORDER — ANASTROZOLE 1 MG PO TABS: 1.0000 mg | ORAL_TABLET | Freq: Every day | ORAL | Status: DC

## 2013-02-09 ENCOUNTER — Ambulatory Visit: Payer: Medicare Other | Admitting: Oncology

## 2013-03-16 NOTE — Progress Notes (Signed)
Subjective:     Patient ID: Whitney Burns, female   DOB: 1940/10/26, 73 y.o.   MRN: [id number]  HPI The patient is a 73 year old white female who is about 2 weeks status post left modified radical mastectomy for a T2 N1 a left breast cancer. She was ER and PR positive and HER-2/neu negative. Since her last visit she fell on her left elbow and is having some pain. She denies any chest wall pain.  Review of Systems     Objective:   Physical Exam On exam her left mastectomy incision is healing nicely with no sign of infection or significant seroma    Assessment:     The patient is 2 weeks status post left modified radical mastectomy for breast cancer     Plan:     At this point we will plan to obtain x-rays of her left elbow and plan to see her back in one week

## 2013-03-22 NOTE — Progress Notes (Signed)
Subjective:     Patient ID: Whitney Burns, female   DOB: 1940/10/26, 73 y.o.   MRN: [id number]  HPI The patient is a 73 year old female who is just over a week out from a left modified radical mastectomy for a T2 N1 A. Left breast cancer. She tolerated the surgery well. She has no complaints today.  Review of Systems     Objective:   Physical Exam On exam her left mastectomy incision is healing nicely with no sign of infection or significant seroma. One of her drains was removed today without difficulty.    Assessment:     The patient is just over a week out from a left modified radical mastectomy for breast cancer     Plan:     At this point she will continue to record the output from the remaining drain. We will plan to see her back in one week to check her progress

## 2013-03-25 ENCOUNTER — Encounter (INDEPENDENT_AMBULATORY_CARE_PROVIDER_SITE_OTHER): Payer: Medicare Other | Admitting: General Surgery

## 2013-03-29 ENCOUNTER — Telehealth: Payer: Self-pay | Admitting: Oncology

## 2013-03-29 NOTE — Telephone Encounter (Signed)
Faxed pt medical records to [facility] [phone]

## 2013-04-04 ENCOUNTER — Telehealth: Payer: Self-pay | Admitting: *Deleted

## 2013-04-04 NOTE — Telephone Encounter (Signed)
Pt called c/o extreme itching for 9 wks since she has been on arimidex.  She has removed all new aspects that may have caused the itching.  Informed pt that this may be a reaction to the Arimidex.  Informed pt to stop taking and call back in 2wks to see if the itching has resolved.  Received verbal understanding.  Pt will call back on 04/18/13 with results of stopping Arimidex.  Dr. Jana Hakim notified.

## 2013-04-14 ENCOUNTER — Telehealth: Payer: Self-pay | Admitting: *Deleted

## 2013-04-14 NOTE — Telephone Encounter (Signed)
Received call from patient stating she has seen a dermatologist and they have given her a topical cream for her itch to see if this helps.  She states being off the Arimidex has not helped the itch.  Instructed patient to start the Arimidex back.  She also states she has seen a medical oncologist in [place] and she is upset because they are making her appointments to see a radiation oncologist and stating she needs radiation therapy.  She states she was at peace with her decision to just take the Arimidex.  She states " I was told by having my breast removed I would not need radiation and I would not get it even if they said I needed it and I'm ok with that and now they have upset me."  She has an appointment on Monday 04/18/13.  Gave support and explained to her that ultimately it her body and her decision what she wants to have done.  Patient very thankful and I encouraged her to call with any needs.

## 2013-04-26 ENCOUNTER — Other Ambulatory Visit: Payer: Self-pay | Admitting: *Deleted

## 2013-04-26 ENCOUNTER — Telehealth: Payer: Self-pay | Admitting: *Deleted

## 2013-04-26 DIAGNOSIS — C50919 Malignant neoplasm of unspecified site of unspecified female breast: Secondary | ICD-10-CM

## 2013-04-26 MED ORDER — EXEMESTANE 25 MG PO TABS: 25.0000 mg | ORAL_TABLET | Freq: Every day | ORAL | Status: AC

## 2013-04-26 NOTE — Telephone Encounter (Signed)
Pt called relay that she stopped taking Arimidex and her itching has subsided within 6 days.  She started it again and her itching started again.  Informed pt that per Dr. Jana Hakim to discontinue Arimidex and she will start Exemestane after she has been off Arimedex x 2wks.  Called script in to [facility] in [place].  Informed pharmacist Rob of pt rxn to Arimidex and gave new script.  Pt know to call with further needs or concerns.

## 2013-05-04 ENCOUNTER — Other Ambulatory Visit: Payer: Self-pay | Admitting: *Deleted

## 2013-05-04 ENCOUNTER — Ambulatory Visit: Payer: Medicare Other | Admitting: Oncology

## 2013-05-04 ENCOUNTER — Other Ambulatory Visit: Payer: Medicare Other

## 2013-05-04 DIAGNOSIS — C50412 Malignant neoplasm of upper-outer quadrant of left female breast: Secondary | ICD-10-CM

## 2013-05-05 ENCOUNTER — Telehealth: Payer: Self-pay | Admitting: Oncology

## 2013-05-05 NOTE — Telephone Encounter (Signed)
Sent letter to patient from Dr. Magrinat. °

## 2013-05-18 ENCOUNTER — Telehealth: Payer: Self-pay | Admitting: Oncology

## 2013-05-18 NOTE — Telephone Encounter (Signed)
PT CALLED TO R/S 3/11 APPT. GV PT NEW APPT FOR 5/21 LB/GM @ 3:30PM.

## 2013-06-22 ENCOUNTER — Telehealth: Payer: Self-pay | Admitting: *Deleted

## 2013-06-22 NOTE — Telephone Encounter (Signed)
Confirmed appt with pt to see Dr. Jana Hakim on 07/14/13.  Pt relate she is moving to [facility] and this will be her last visit.

## 2013-06-30 ENCOUNTER — Ambulatory Visit (INDEPENDENT_AMBULATORY_CARE_PROVIDER_SITE_OTHER): Payer: Medicare Other | Admitting: Otolaryngology

## 2013-07-07 ENCOUNTER — Telehealth: Payer: Self-pay | Admitting: Oncology

## 2013-07-07 NOTE — Telephone Encounter (Signed)
lvm for pt regarding to May appt moved to June per MD pof....mailed pt appt sched and res letter

## 2013-07-11 ENCOUNTER — Telehealth: Payer: Self-pay | Admitting: *Deleted

## 2013-07-11 ENCOUNTER — Telehealth: Payer: Self-pay | Admitting: Oncology

## 2013-07-11 NOTE — Telephone Encounter (Signed)
Spoke to pt concerning her move to [facility].  Pt relate she has sold her home in [place] and will be moving to [facility] permanently.  MR have already been sent to med onc in [facility] and she is established. Pt denies further needs at this time.  Encourage pt to call with questions or concerns.

## 2013-07-11 NOTE — Telephone Encounter (Signed)
pt moving to [facility] call to cx appt...done

## 2013-07-14 ENCOUNTER — Encounter: Payer: Medicare Other | Admitting: Oncology

## 2013-07-14 ENCOUNTER — Other Ambulatory Visit: Payer: Medicare Other

## 2013-07-27 ENCOUNTER — Ambulatory Visit: Payer: Medicare Other | Admitting: Physician Assistant

## 2013-07-27 ENCOUNTER — Other Ambulatory Visit: Payer: Medicare Other

## 2013-09-16 ENCOUNTER — Telehealth: Payer: Self-pay | Admitting: Cardiovascular Disease

## 2013-09-16 NOTE — Telephone Encounter (Signed)
Closed encounter °

## 2013-12-05 NOTE — Telephone Encounter (Signed)
NONE 

## 2014-06-23 IMAGING — MG MM DIGITAL DIAGNOSTIC UNILAT*L*
2 series · 2 of 2 positions shown · non-contrast
Comparison: 12/17/2012 and earlier

CLINICAL DATA: Status post ultrasound-guided core biopsy of 2
lesions within the left breast, 1 o'clock location.

EXAM:
DIGITAL DIAGNOSTIC UNILATERAL LEFT MAMMOGRAM

[L CC]
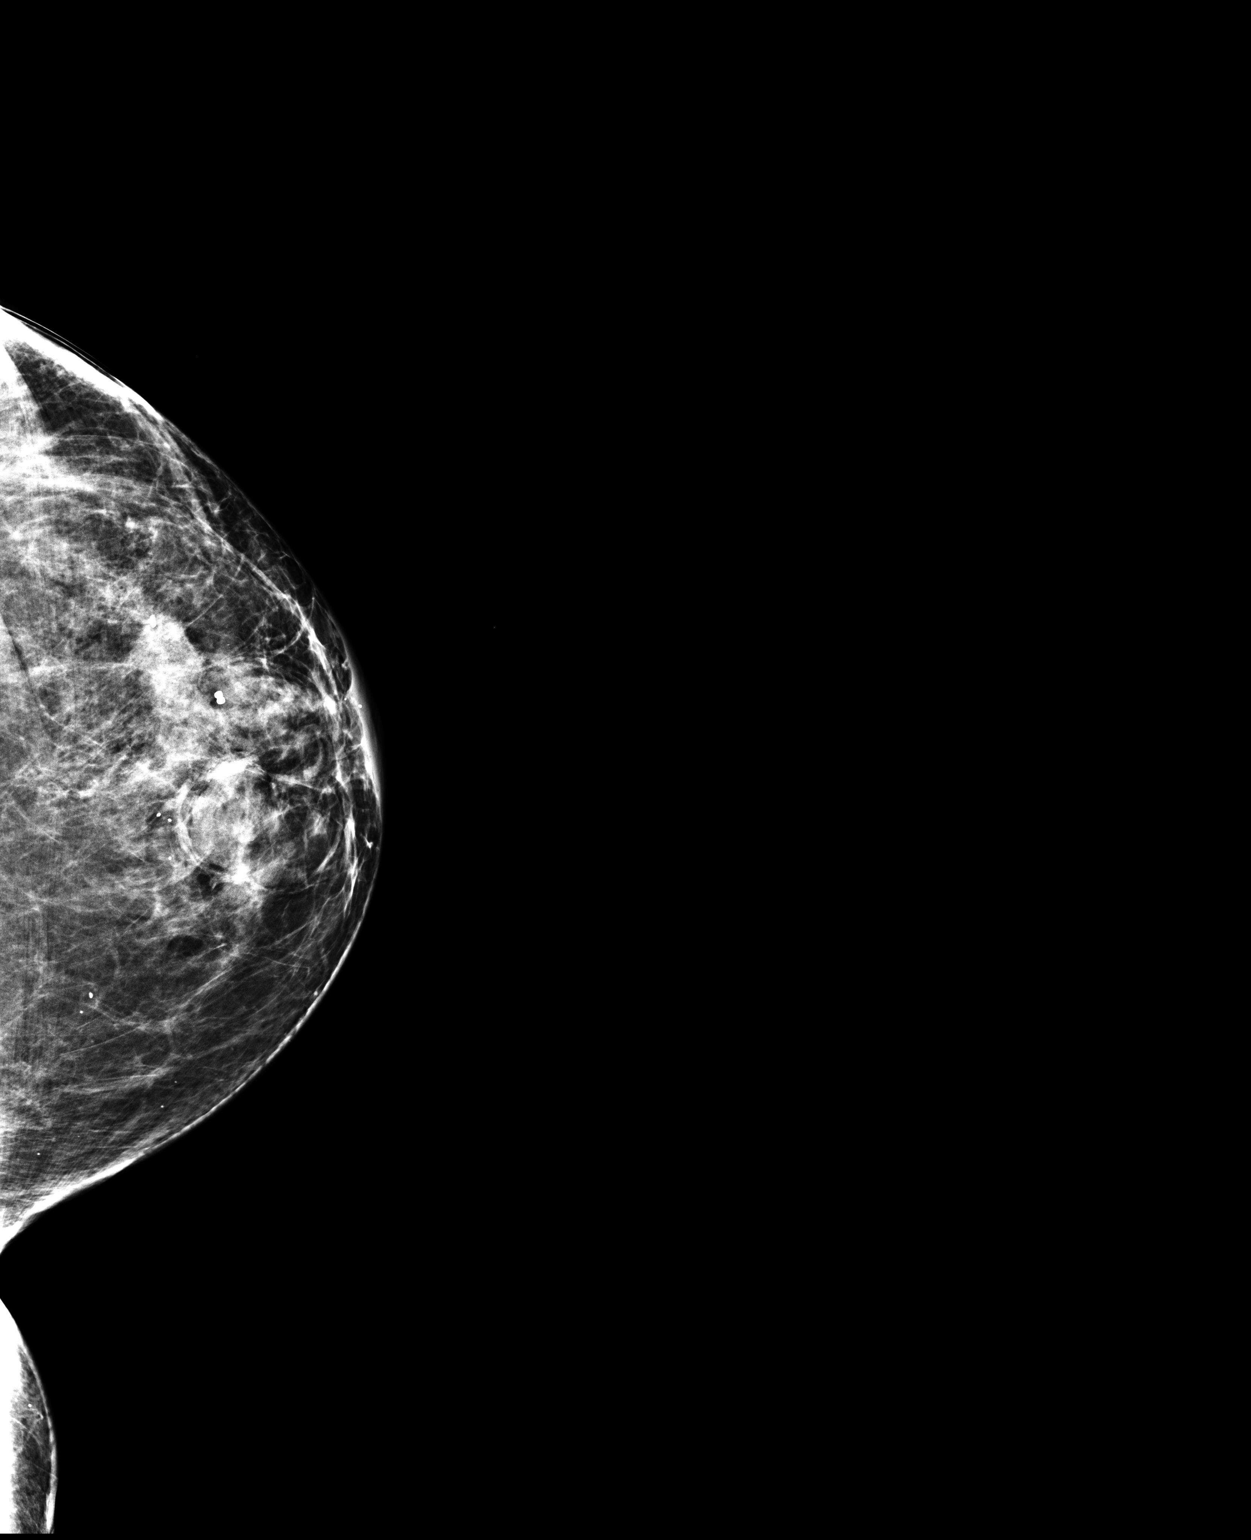

[L ML]
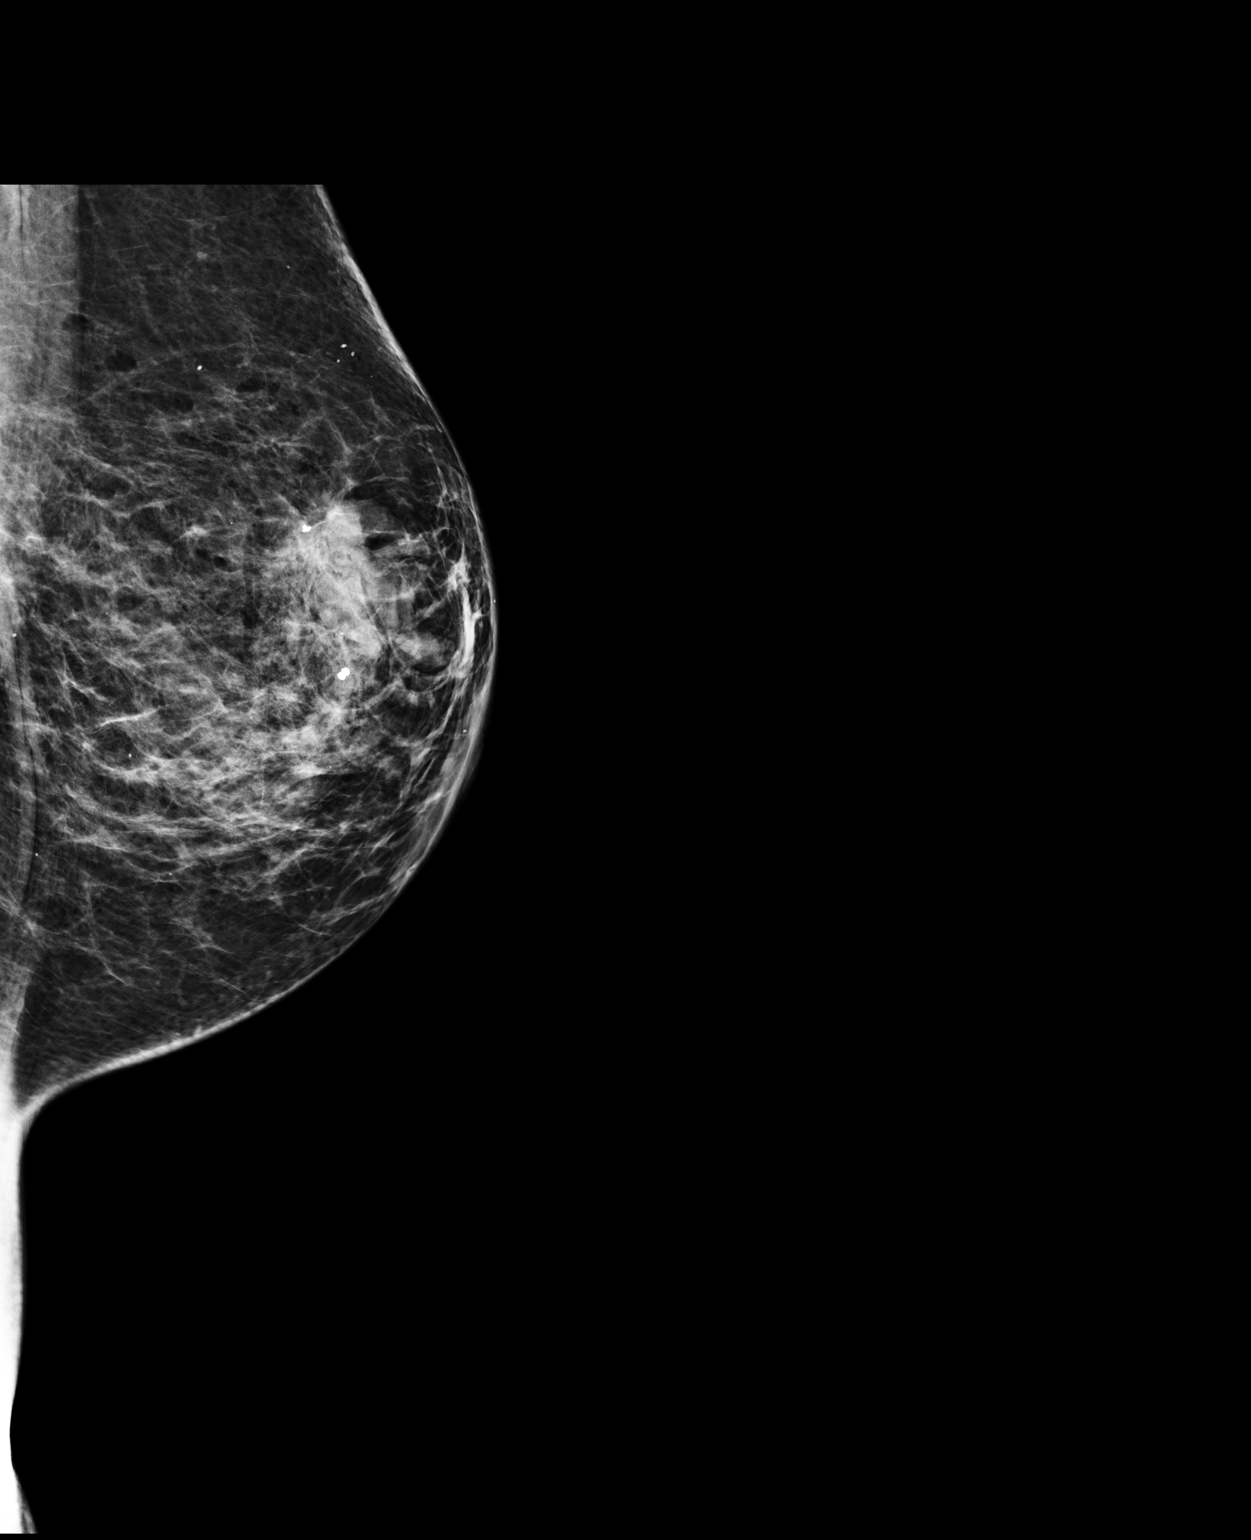

[2 of 2 positions shown; findings below may reference images not displayed]

FINDINGS: Mammographic images were obtained following ultrasound guided biopsy
of mass in the 1 o'clock location of the left breast, 4 cm from the
nipple and 1 o'clock location of the left breast, 3 cm from the
nipple. A top hat shaped clip is identified 4 cm from the nipple. A
dumbbell-shaped clip is identified 3 cm from the nipple. The 2 clips
are approximately 3.1 cm apart. Of note, axillary clip was not
placed.
IMPRESSION: Tissue marker clips are in expected location after biopsies.

Final Assessment: Post Procedure Mammograms for Marker Placement

## 2014-06-23 IMAGING — US US BREAST*L*
1 series · 13 of 20 positions shown · non-contrast
Comparison: 11/26/2012 and earlier

CLINICAL DATA: The patient returns after screening study for
evaluation of the left breast.

EXAM:
DIGITAL DIAGNOSTIC  left MAMMOGRAM
ULTRASOUND left BREAST

[Series 1: breast · 13 of 20 slices shown]
[im 1/20]
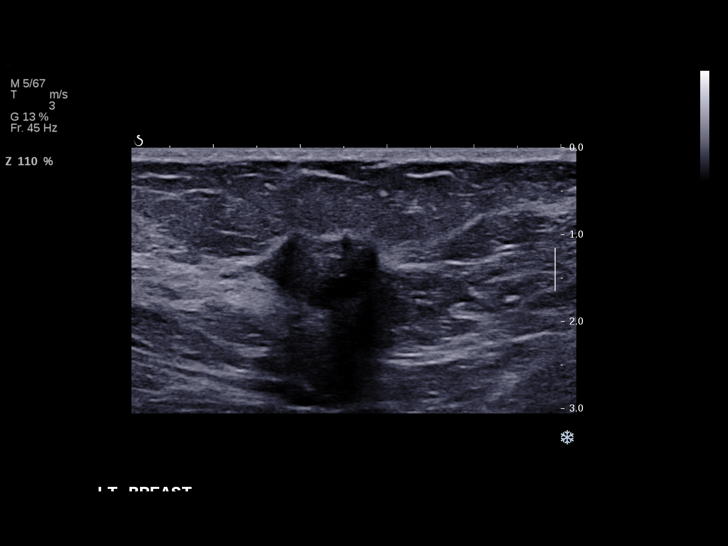
[im 3/20]
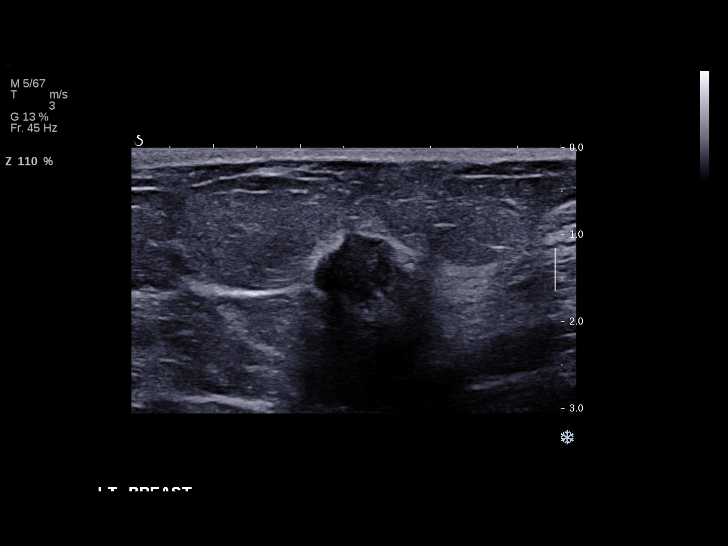
[im 4/20]
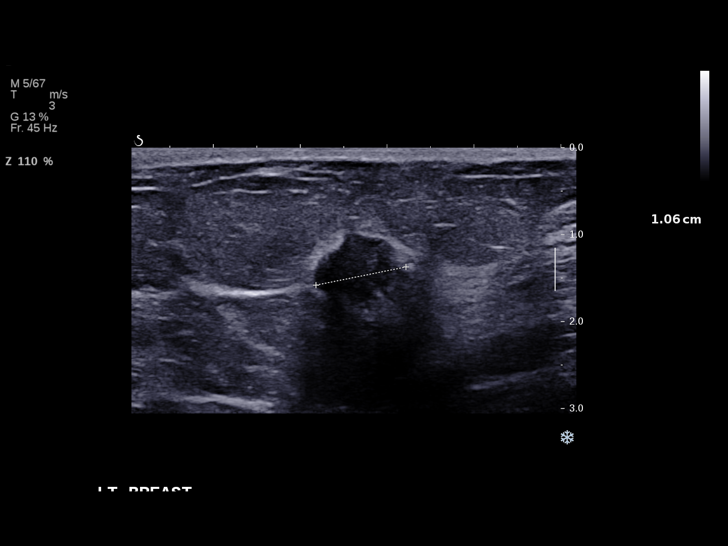
[im 6/20]
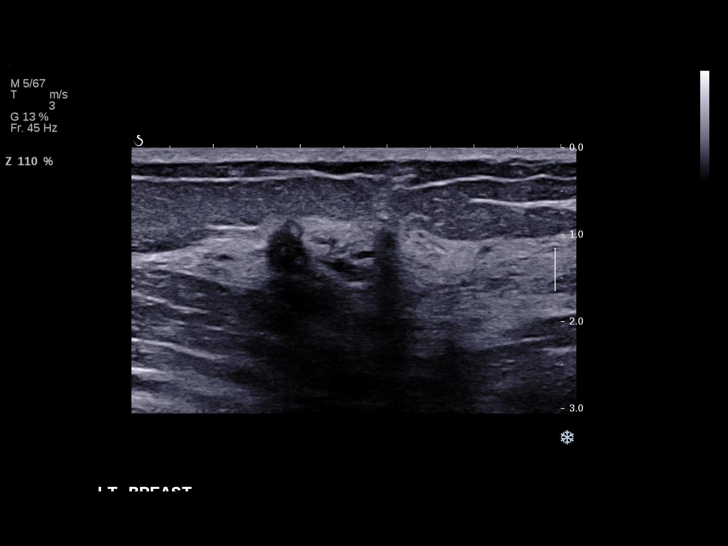
[im 7/20]
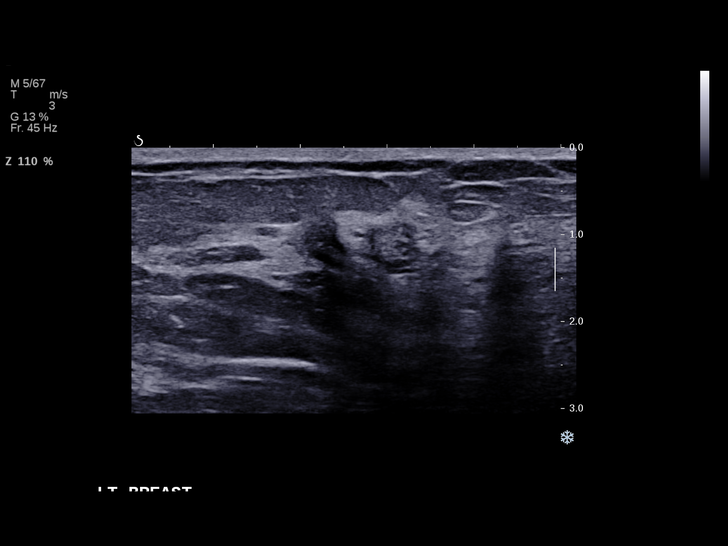
[im 9/20]
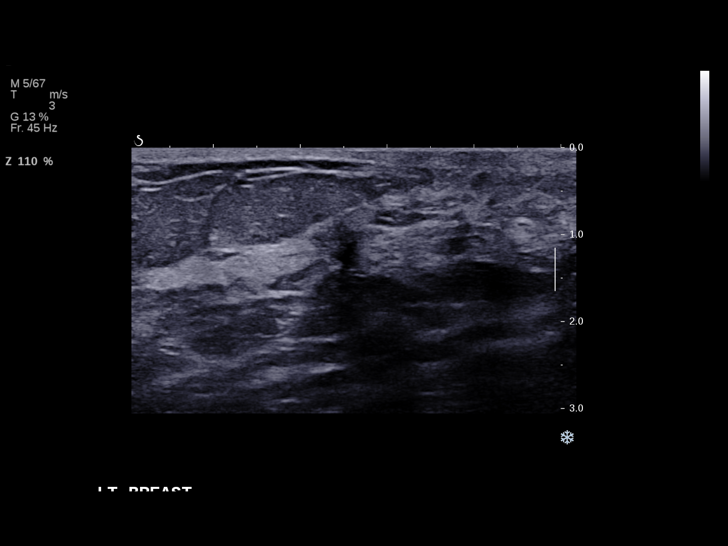
[im 11/20]
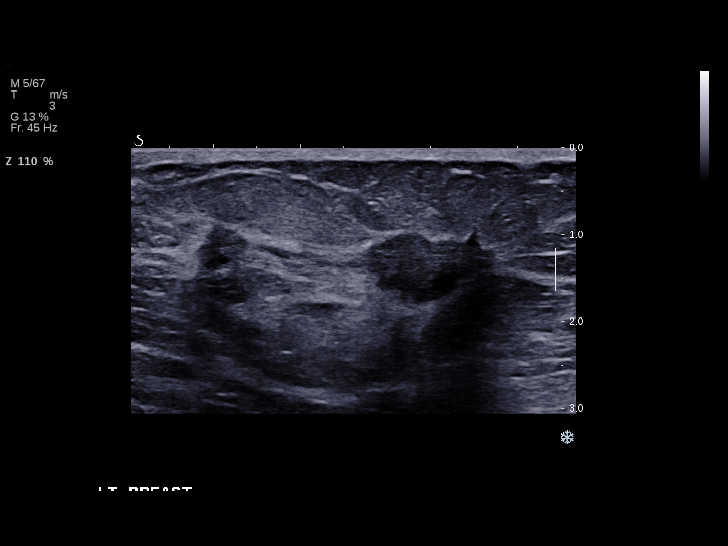
[im 12/20]
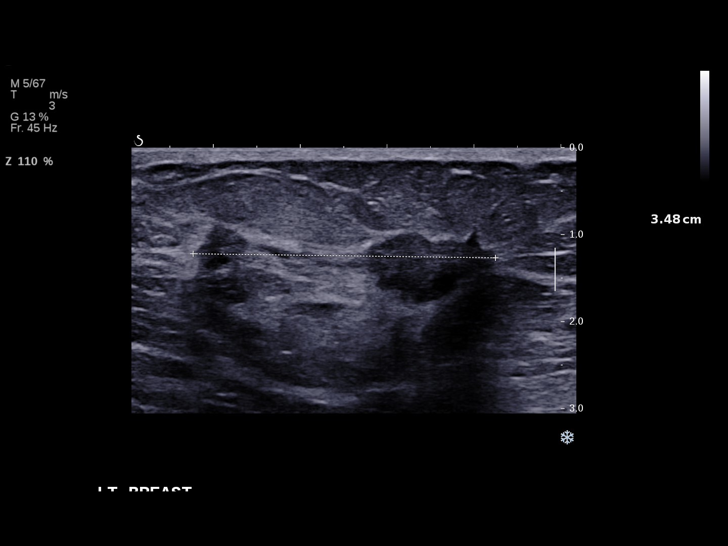
[im 14/20]
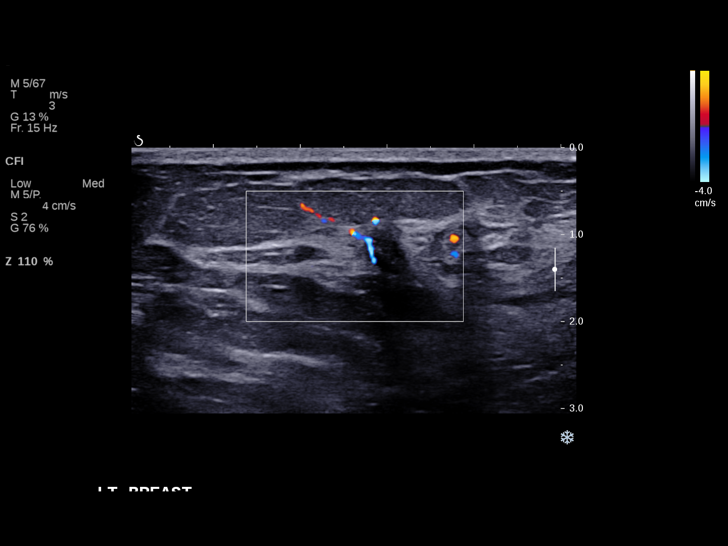
[im 15/20]
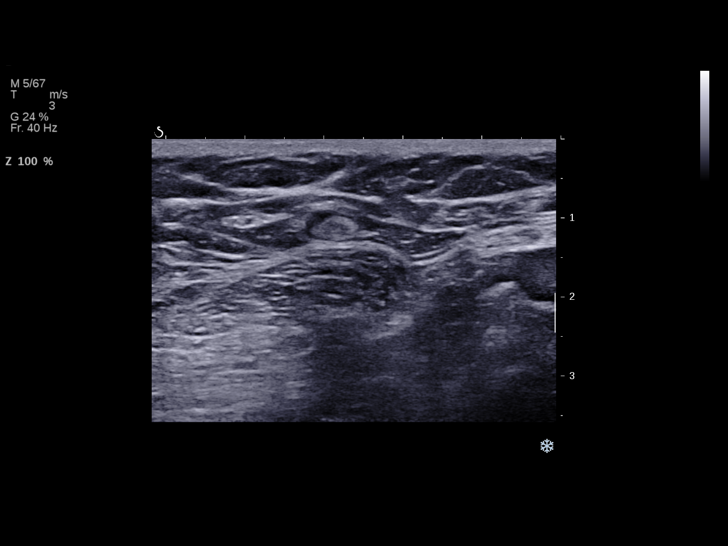
[im 17/20]
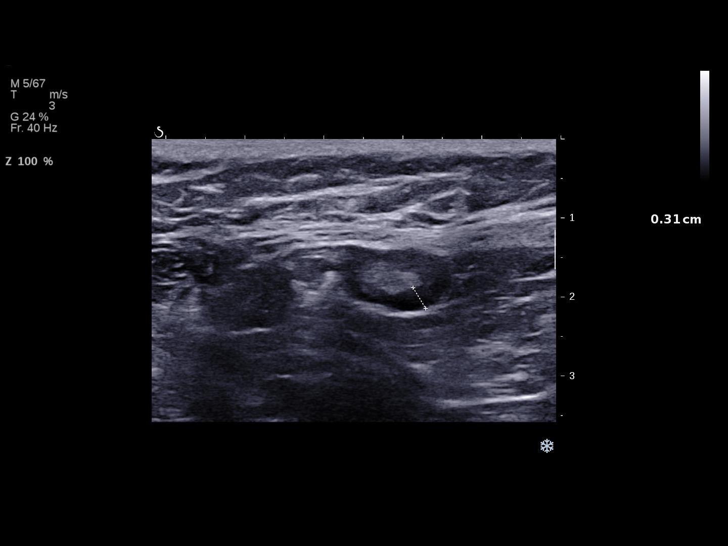
[im 18/20]
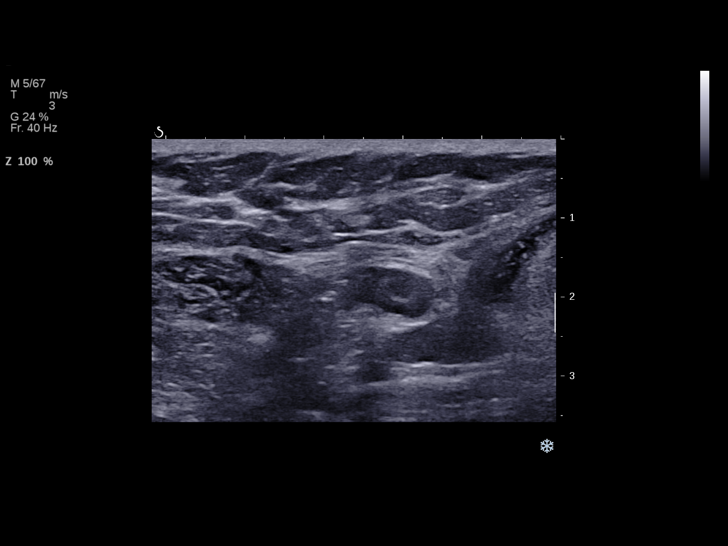
[im 20/20]
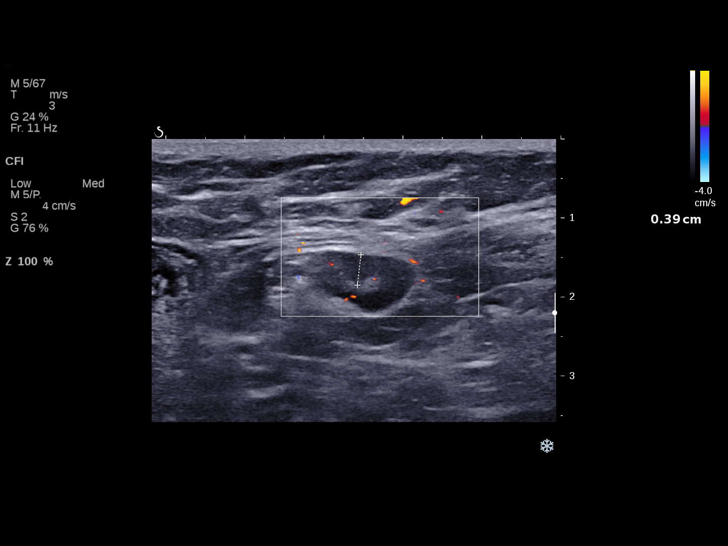

[13 of 20 positions shown; findings below may reference images not displayed]

ACR Breast Density Category b: There are scattered areas of
fibroglandular density.
FINDINGS: Spot compression views confirm presence of an irregular masslike
density in the upper-outer quadrant of the left breast. There are
associated microcalcifications, primarily rounded in morphology.

On physical exam I palpate soft thickening in the upper-outer
quadrant of the left breast. I palpate no discrete mass however.

Ultrasound is performed, showing an irregular hypoechoic mass in the
1 o'clock location of the left breast, 4 cm from the nipple. This is
associated with internal calcifications and acoustic shadowing. Mass
measures 1.6 x 0.9 x 1.1 cm. In the 1 o'clock location, 3 cm from
the nipple, a smaller mass has a similar appearance and measures
x 0.6 x 0.6 cm. Both lesion show increased blood flow on Doppler
evaluation. The entire abnormality, inclusive of both lesions is
cm.

Evaluation of the left axilla shows a single lymph node with
slightly thickened cortex, warranting biopsy.
IMPRESSION: 1. Suspicious mass in the 1 o'clock location of the left breast, 4
cm from the nipple.
2. Suspicious mass in the 1 o'clock location of the left breast, 3
cm from the nipple.
3. Suspicious left axillary lymph node.

RECOMMENDATION:
Ultrasound-guided core biopsy of the 3 lesions is recommended. The
patient request biopsy on the same day. This is performed and
dictated separately.

I have discussed the findings and recommendations with the patient.
Results were also provided in writing at the conclusion of the
visit. If applicable, a reminder letter will be sent to the patient
regarding the next appointment.

BI-RADS CATEGORY  4: Suspicious abnormality - biopsy should be
considered.

## 2017-04-29 NOTE — Progress Notes (Signed)
This encounter was created in error - please disregard.
